# Patient Record
Sex: Female | Born: 1955 | Race: White | Hispanic: No | Marital: Single | Smoking: Former smoker
Health system: Southern US, Community
[De-identification: ages and names within clinical notes are randomized; demographics above are authoritative.]

## PROBLEM LIST (undated history)

## (undated) DIAGNOSIS — K5900 Constipation, unspecified: Secondary | ICD-10-CM

## (undated) DIAGNOSIS — R002 Palpitations: Secondary | ICD-10-CM

## (undated) DIAGNOSIS — K8020 Calculus of gallbladder without cholecystitis without obstruction: Secondary | ICD-10-CM

## (undated) DIAGNOSIS — E039 Hypothyroidism, unspecified: Secondary | ICD-10-CM

## (undated) HISTORY — DX: Hypothyroidism, unspecified: E03.9

## (undated) HISTORY — PX: COLONOSCOPY: SHX174

## (undated) HISTORY — DX: Constipation, unspecified: K59.00

## (undated) HISTORY — PX: CHOLECYSTECTOMY: SHX55

## (undated) HISTORY — DX: Palpitations: R00.2

## (undated) HISTORY — DX: Calculus of gallbladder without cholecystitis without obstruction: K80.20

## (undated) SURGERY — ERCP, WITH INTERVENTION IF INDICATED
Anesthesia: Moderate Sedation

---

## 1998-07-22 ENCOUNTER — Other Ambulatory Visit: Admission: RE | Admit: 1998-07-22 | Discharge: 1998-07-22 | Payer: Self-pay | Admitting: Obstetrics and Gynecology

## 1999-09-22 ENCOUNTER — Other Ambulatory Visit: Admission: RE | Admit: 1999-09-22 | Discharge: 1999-09-22 | Payer: Self-pay | Admitting: Obstetrics and Gynecology

## 2000-10-14 ENCOUNTER — Observation Stay (HOSPITAL_COMMUNITY): Admission: EM | Admit: 2000-10-14 | Discharge: 2000-10-15 | Payer: Self-pay | Admitting: Emergency Medicine

## 2000-11-06 ENCOUNTER — Other Ambulatory Visit: Admission: RE | Admit: 2000-11-06 | Discharge: 2000-11-06 | Payer: Self-pay | Admitting: Obstetrics and Gynecology

## 2002-01-08 ENCOUNTER — Encounter: Payer: Self-pay | Admitting: Family Medicine

## 2002-01-08 ENCOUNTER — Ambulatory Visit (HOSPITAL_COMMUNITY): Admission: RE | Admit: 2002-01-08 | Discharge: 2002-01-08 | Payer: Self-pay | Admitting: Family Medicine

## 2002-01-13 ENCOUNTER — Encounter: Payer: Self-pay | Admitting: Family Medicine

## 2002-01-13 ENCOUNTER — Encounter: Admission: RE | Admit: 2002-01-13 | Discharge: 2002-01-13 | Payer: Self-pay | Admitting: Family Medicine

## 2002-10-09 HISTORY — PX: ANKLE SURGERY: SHX546

## 2003-03-30 ENCOUNTER — Encounter: Admission: RE | Admit: 2003-03-30 | Discharge: 2003-03-30 | Payer: Self-pay | Admitting: Gynecology

## 2003-03-30 ENCOUNTER — Other Ambulatory Visit: Admission: RE | Admit: 2003-03-30 | Discharge: 2003-03-30 | Payer: Self-pay | Admitting: Gynecology

## 2003-03-30 ENCOUNTER — Encounter: Payer: Self-pay | Admitting: Gynecology

## 2004-03-30 ENCOUNTER — Encounter: Admission: RE | Admit: 2004-03-30 | Discharge: 2004-03-30 | Payer: Self-pay | Admitting: Gynecology

## 2004-04-04 ENCOUNTER — Other Ambulatory Visit: Admission: RE | Admit: 2004-04-04 | Discharge: 2004-04-04 | Payer: Self-pay | Admitting: Gynecology

## 2005-04-25 ENCOUNTER — Encounter: Admission: RE | Admit: 2005-04-25 | Discharge: 2005-04-25 | Payer: Self-pay | Admitting: Gynecology

## 2005-05-01 ENCOUNTER — Other Ambulatory Visit: Admission: RE | Admit: 2005-05-01 | Discharge: 2005-05-01 | Payer: Self-pay | Admitting: Gynecology

## 2006-06-07 ENCOUNTER — Other Ambulatory Visit: Admission: RE | Admit: 2006-06-07 | Discharge: 2006-06-07 | Payer: Self-pay | Admitting: Gynecology

## 2006-07-02 ENCOUNTER — Encounter: Admission: RE | Admit: 2006-07-02 | Discharge: 2006-07-02 | Payer: Self-pay | Admitting: Gynecology

## 2006-11-26 ENCOUNTER — Emergency Department (HOSPITAL_COMMUNITY): Admission: EM | Admit: 2006-11-26 | Discharge: 2006-11-26 | Payer: Self-pay | Admitting: Family Medicine

## 2008-07-15 ENCOUNTER — Emergency Department (HOSPITAL_COMMUNITY): Admission: EM | Admit: 2008-07-15 | Discharge: 2008-07-15 | Payer: Self-pay | Admitting: Family Medicine

## 2008-09-01 ENCOUNTER — Ambulatory Visit: Payer: Self-pay | Admitting: Internal Medicine

## 2008-09-10 ENCOUNTER — Telehealth: Payer: Self-pay | Admitting: Internal Medicine

## 2009-06-25 ENCOUNTER — Other Ambulatory Visit: Admission: RE | Admit: 2009-06-25 | Discharge: 2009-06-25 | Payer: Self-pay | Admitting: Family Medicine

## 2010-06-09 ENCOUNTER — Emergency Department (HOSPITAL_COMMUNITY): Admission: EM | Admit: 2010-06-09 | Discharge: 2010-06-09 | Payer: Self-pay | Admitting: Emergency Medicine

## 2010-12-06 ENCOUNTER — Other Ambulatory Visit (HOSPITAL_COMMUNITY)
Admission: RE | Admit: 2010-12-06 | Discharge: 2010-12-06 | Disposition: A | Discharge status: Home / Self Care | Payer: Medicaid Other | Source: Ambulatory Visit | Attending: Family Medicine | Admitting: Family Medicine

## 2010-12-06 ENCOUNTER — Other Ambulatory Visit: Payer: Self-pay | Admitting: Family Medicine

## 2010-12-06 DIAGNOSIS — Z124 Encounter for screening for malignant neoplasm of cervix: Secondary | ICD-10-CM | POA: Insufficient documentation

## 2010-12-22 LAB — LIPASE, BLOOD: Lipase: 37 U/L (ref 11–59)

## 2010-12-22 LAB — DIFFERENTIAL
Basophils Absolute: 0 10*3/uL (ref 0.0–0.1)
Basophils Relative: 1 % (ref 0–1)
Lymphocytes Relative: 33 % (ref 12–46)
Monocytes Absolute: 0.6 10*3/uL (ref 0.1–1.0)
Neutro Abs: 4.1 10*3/uL (ref 1.7–7.7)

## 2010-12-22 LAB — COMPREHENSIVE METABOLIC PANEL
AST: 96 U/L — ABNORMAL HIGH (ref 0–37)
BUN: 11 mg/dL (ref 6–23)
CO2: 27 mEq/L (ref 19–32)
Calcium: 9.7 mg/dL (ref 8.4–10.5)
Chloride: 101 mEq/L (ref 96–112)
Creatinine, Ser: 1.18 mg/dL (ref 0.4–1.2)
GFR calc non Af Amer: 48 mL/min — ABNORMAL LOW (ref >60)
Glucose, Bld: 113 mg/dL — ABNORMAL HIGH (ref 70–99)
Total Bilirubin: 0.9 mg/dL (ref 0.3–1.2)

## 2010-12-22 LAB — CBC
HCT: 37.5 % (ref 36.0–46.0)
Hemoglobin: 13.1 g/dL (ref 12.0–15.0)
MCH: 32.8 pg (ref 26.0–34.0)
MCV: 94.1 fL (ref 78.0–100.0)
RBC: 3.98 MIL/uL (ref 3.87–5.11)
WBC: 7.4 10*3/uL (ref 4.0–10.5)

## 2010-12-22 LAB — URINALYSIS, ROUTINE W REFLEX MICROSCOPIC
Bilirubin Urine: NEGATIVE
Ketones, ur: NEGATIVE mg/dL
Nitrite: NEGATIVE
Urobilinogen, UA: 0.2 mg/dL (ref 0.0–1.0)
pH: 6 (ref 5.0–8.0)

## 2010-12-22 LAB — URINE MICROSCOPIC-ADD ON

## 2011-02-24 NOTE — Op Note (Signed)
Krista Chapman [place]  Patient:    Krista Chapman, Krista Chapman                        MRN: [id number] Proc. Date: 10/14/00 Attending:  Jearld Adjutant, M.D. CC:         Office of Jearld Adjutant, M.D.  Otho Darner, M.D., Urgent Care  [facility]   Operative Report  PREOPERATIVE DIAGNOSIS:  Left ankle medial glass laceration puncture wound.  POSTOPERATIVE DIAGNOSES: 1. Left ankle medial glass laceration puncture wound with 10 additional tiny    shards of glass found. 2. A 75% laceration of posterior tibialis tendon. 3. Posterior one-third deep deltoid ligament laceration.  PROCEDURES: 1. Left ankle expiration and excision of glass foreign body. 2. Irrigation and debridement. 3. Repair posterior deltoid ligament. 4. Repair posterior tibialis tendon.  SURGEON:  Jearld Adjutant, M.D.  ASSISTANT:  ______ .  ANESTHESIA:  General with LMA.  CULTURES:  None.  DRAINS:  None.  ESTIMATED BLOOD LOSS:  Minimal.  TOURNIQUET TIME:  57 minutes.  PATHOLOGIC FINDINGS AND HISTORY:  [name] is a 55 year old female who claimed last evening to be up on a stepladder when she fell, twisting her ankle.  She felt she snagged it on something.  She began to have a pain today, noticed this laceration, and came into Urgent Care where Dr. Eula Listen and his P.A. probed the area after numbing it up and got several shards of glass out.  They felt they could see the shards on x-ray and that there were still some left.  I was then consulted, and I further probed it but felt I could not get to it without opening the wound further under anesthesia. Therefore, she was brought to [facility] where I made a zigzag incision on the proximal and distal limb, incorporating the slightly oblique transverse 1.5- to 2-cm wound.  I then dissected down and found that she had a laceration into one of the vessels, a small branch of the posterior tibialis artery, i.e.,  the tibial artery, and we tied this off.  There was also a laceration into the tendon sheath with a 75% laceration of the posterior tibialis tendon which was working preoperatively but only by this small strand.  In addition, there was a laceration of the posterior one-third of the deep deltoid ligament.  I probed all the way to the talus where the x-ray suggested the bone was.  I found several glass fragments in the more superficial neurovascular bundle area and several down deep to the bone.  At the end, I used a head lamp, 3.5 loops, and looked down against the bone, scraping it with a hemostat, and I could not feel any more gritty material that I had felt initially, just bone.  It was thoroughly flushed out deep against the talus and underneath the medial malleolus up and in all layers.  The specimen was given to the patient.  We repaired the posterior tibialis tendon with a Kessler-type stitch and a couple of interrupteds of 5-0 nylon and then tied off the vessel of the tibial artery and repaired the tendon sheath of the posterior tibialis tendon and repaired the posterior medial deltoid ligament with 2 figure-of-eight #0 Vicryl stitches.  DESCRIPTION OF PROCEDURE:  MAC anesthesia was obtained using LMA technique, 1 g Ancef, and a loading dose of tobramycin given for prophylaxis due to this environmental puncture wound.  The patient was  to have been given a tetanus toxoid in the triage area as Urgent Care did not have the medication.  I called the triage nurse for this.  We then prepped and draped the left foot in a standard fashion.  After standard prepping and draping, Esmarch exsanguination was used.  The tourniquet was blown up to 350 and later, 400 mmHg.  We made a proximal limb to connect to the anterior portion of the oblique transverse laceration just posterior and medial to the medial malleolus and then a distal limb in similar fashion on the posterior aspect of it.  The  incision was deep and sharp with a knife.  Hemostasis was obtained using the Bovie electrocoagulator, and dissection was carried down, and the retinaculum was released.  This exposed the neurovascular bundle.  The medial lateral plantar branches of the tibial nerve were identified and were not lacerated, and they were traced, and a tarsal tunnel release was carried out so they would not have pressure on them, especially distal.  We carefully observed them to make sure that there was no significant laceration of the nerve.  The laceration of the blood vessel was noted.  That was then tied off with 4-0 silk.  The main artery was still patent.  I then explored the tendon sheaths of the posterior tibialis tendon and the deep deltoid, flushing and carefully with loop ______ looking for all the glass shards.  These were removed, and the joint was thoroughly flushed down deep to the talus underneath the medial malleolus.  We then repaired the posterior deltoid ligament with 2 figure-of-eight #0 Vicryl sutures.  We repaired the posterior tibialis tendon with a Kessler stitch, as above, 5-0 clear nylon and the tendon sheath with 3-0 Vicryl.  I then closed the subcutaneous layer with 3-0 Vicryl and the skin with staples.  A bulky, sterile compressive dressing was applied with the ankle in neutral with a posterior plaster splint in a neutral position.  The patient, having tolerated the procedure well, was awakened and taken to the recovery room in satisfactory condition to be admitted for overnight antibiotics and pain medications.  She will be discharged tomorrow with nonweight-bearing with crutches to the office for placement of a Cam walker. DD:  10/14/00 TD:  10/14/00 Job: [id number]

## 2011-02-27 ENCOUNTER — Other Ambulatory Visit: Payer: Self-pay | Admitting: Family Medicine

## 2011-02-27 DIAGNOSIS — Z1231 Encounter for screening mammogram for malignant neoplasm of breast: Secondary | ICD-10-CM

## 2011-03-03 ENCOUNTER — Ambulatory Visit
Admission: RE | Admit: 2011-03-03 | Discharge: 2011-03-03 | Disposition: A | Discharge status: Home / Self Care | Payer: BC Managed Care – PPO | Source: Ambulatory Visit | Attending: Family Medicine | Admitting: Family Medicine

## 2011-03-03 DIAGNOSIS — Z1231 Encounter for screening mammogram for malignant neoplasm of breast: Secondary | ICD-10-CM

## 2011-04-07 ENCOUNTER — Telehealth: Payer: Self-pay | Admitting: *Deleted

## 2011-04-07 NOTE — Telephone Encounter (Signed)
Per email from MR pt needs to have PFT ASAP and then ov with him in August. Pt set to have PFT on 04/10/11 at 9am and OV on 05-10-11. Carron Curie, CMA

## 2011-04-10 ENCOUNTER — Ambulatory Visit (INDEPENDENT_AMBULATORY_CARE_PROVIDER_SITE_OTHER): Payer: BC Managed Care – PPO | Admitting: Internal Medicine

## 2011-04-10 DIAGNOSIS — R0989 Other specified symptoms and signs involving the circulatory and respiratory systems: Secondary | ICD-10-CM

## 2011-04-10 DIAGNOSIS — R0609 Other forms of dyspnea: Secondary | ICD-10-CM

## 2011-04-10 LAB — PULMONARY FUNCTION TEST

## 2011-04-10 NOTE — Progress Notes (Signed)
PFT done today. 

## 2011-04-11 ENCOUNTER — Telehealth: Payer: Self-pay | Admitting: Internal Medicine

## 2011-04-11 NOTE — Telephone Encounter (Signed)
pfts only show isolated low dlco. Needs walking test when I see patient on 05/10/2011. Also needs records from [facility]. If symptoms get worse, needs to see another provider

## 2011-04-14 ENCOUNTER — Telehealth: Payer: Self-pay | Admitting: Internal Medicine

## 2011-04-14 NOTE — Telephone Encounter (Signed)
Spoke with the pt and advised of PFT results and the need for walk test at next OV. I will get records from [facility]. Carron Curie, CMA

## 2011-04-14 NOTE — Telephone Encounter (Signed)
Duplicate messgae.Krista Chapman, CMA

## 2011-04-17 ENCOUNTER — Encounter: Payer: Self-pay | Admitting: Internal Medicine

## 2011-04-26 ENCOUNTER — Other Ambulatory Visit: Payer: Self-pay | Admitting: Gastroenterology

## 2011-05-10 ENCOUNTER — Ambulatory Visit (INDEPENDENT_AMBULATORY_CARE_PROVIDER_SITE_OTHER): Payer: BC Managed Care – PPO | Admitting: Internal Medicine

## 2011-05-10 ENCOUNTER — Encounter: Payer: Self-pay | Admitting: Internal Medicine

## 2011-05-10 VITALS — BP 118/78 | HR 64 | Temp 98.0°F | Ht 66.0 in | Wt 165.0 lb

## 2011-05-10 DIAGNOSIS — R942 Abnormal results of pulmonary function studies: Secondary | ICD-10-CM

## 2011-05-10 DIAGNOSIS — R0609 Other forms of dyspnea: Secondary | ICD-10-CM

## 2011-05-10 DIAGNOSIS — R0989 Other specified symptoms and signs involving the circulatory and respiratory systems: Secondary | ICD-10-CM

## 2011-05-10 DIAGNOSIS — R0600 Dyspnea, unspecified: Secondary | ICD-10-CM | POA: Insufficient documentation

## 2011-05-10 DIAGNOSIS — R0981 Nasal congestion: Secondary | ICD-10-CM

## 2011-05-10 NOTE — Progress Notes (Signed)
Subjective:    Patient ID: Krista Chapman, female    DOB: 03/25/1956, 55 y.o.   MRN: [id number]  HPI  55 year old. Referred by Dr. Allyson Sabal for dyspnea. Insidious onset 7-8 months ago. Stable since onset. Brought on mostly be exertion and relieved by rest. However, does notice dyspnea during long conversations at rest as well. 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 She exercises regularly by fast walking 3 times a week on average for 45 minutes. She gets dyspneic during this as soon as she starts walking but she is able to continue but when walking up an incline she will have to stop 2-3 times and take rest. There is asssociated increased diaophresis and facial redness compared to friends but this is chronic and does not feel is related to symptoms. There is no associated chest pain (cardiac workup negative including myoview stres test and resting echo per review of Dr. Allyson Sabal file), wheezing, cough, syncope, pre-syncope (though admits to occassional dizzy spells +), orthopnea, paroxysmal nocturnal dyspnea. There was one associated episode of swaying to left side some months ago during walking  Past med hx significant for almost non-smoker but prior heavy passive smoking in 1980s for > 10 years, possible stage 3 Chronic kidney disease (Dr Caryn Section, 24h urine protein pending), PACs and occ PVCs. At work, inhales chemicals (nails, hair color - ongoing). Fam hx - died of emphysema, maternal aunt with asthma.   She is concerned about etiology. FEels her parents died from heart and lung disease and she feels she wants to be ahead of the curve. Also wondering if anxiety is causing dyspnea and if chronic nasal blockage from sinuses is contributing  PFTs last month  Are normal but has isolated low diffusion 67%  Past Medical History    Diagnosis Date  . Gallstones   . Palpitation      Family History  Problem Relation Age of Onset  . Emphysema Father   . Heart disease Mother      History   Social History  . Marital Status: Single    Spouse Name: N/A    Number of Children: N/A  . Years of Education: N/A   Occupational History  . comsmet[employer]    Social History Main Topics  . Smoking status: Former Smoker -- 0.1 packs/day for 3 years    Types: Cigarettes    Quit date: 10/09/1988  . Smokeless tobacco: Not on file  . Alcohol Use: Yes     social  . Drug Use: No  . Sexually Active: Not on file   Other Topics Concern  . Not on file   Social History Narrative  . No narrative on file     Allergies  Allergen Reactions  . Codeine     REACTION: nausea     No outpatient prescriptions prior to visit.      Review of Systems  Constitutional: Negative for fever and unexpected weight change.  HENT: Positive for congestion. Negative for ear pain, nosebleeds, sore throat, rhinorrhea, sneezing, trouble swallowing, dental problem, postnasal drip and sinus pressure.   Eyes: Negative for redness and itching.  Respiratory: Positive for shortness of breath. Negative for cough, chest tightness and wheezing.   Cardiovascular: Positive for palpitations. Negative for leg swelling.  Gastrointestinal: Negative for nausea and vomiting.  Genitourinary: Negative for  dysuria.  Musculoskeletal: Negative for joint swelling.  Skin: Negative for rash.  Neurological: Positive for headaches.  Hematological: Does not bruise/bleed easily.  Psychiatric/Behavioral: Positive for dysphoric mood. The patient is not nervous/anxious.        Objective:   Physical Exam  Vitals reviewed. Constitutional: She is oriented to person, place, and time. She appears well-developed and well-nourished. No distress.  HENT:  Head: Normocephalic and atraumatic.  Right Ear: External ear normal.  Left Ear: External ear normal.   Mouth/Throat: Oropharynx is clear and moist. No oropharyngeal exudate.  Eyes: Conjunctivae and EOM are normal. Pupils are equal, round, and reactive to light. Right eye exhibits no discharge. Left eye exhibits no discharge. No scleral icterus.  Neck: Normal range of motion. Neck supple. No JVD present. No tracheal deviation present. No thyromegaly present.  Cardiovascular: Normal rate, regular rhythm, normal heart sounds and intact distal pulses.  Exam reveals no gallop and no friction rub.   No murmur heard. Pulmonary/Chest: Effort normal and breath sounds normal. No respiratory distress. She has no wheezes. She has no rales. She exhibits no tenderness.  Abdominal: Soft. Bowel sounds are normal. She exhibits no distension and no mass. There is no tenderness. There is no rebound and no guarding.  Musculoskeletal: Normal range of motion. She exhibits no edema and no tenderness.  Lymphadenopathy:    She has no cervical adenopathy.  Neurological: She is alert and oriented to person, place, and time. She has normal reflexes. No cranial nerve deficit. She exhibits normal muscle tone. Coordination normal.  Skin: Skin is warm and dry. No rash noted. She is not diaphoretic. No erythema. No pallor.  Psychiatric: She has a normal mood and affect. Her behavior is normal. Judgment and thought content normal.       Mild anxious disposition          Assessment & Plan:

## 2011-05-10 NOTE — Assessment & Plan Note (Signed)
She has isolated low diffusion in PFTs. Denies being anemic. DDx is sublte emphysema or ILD (hx of passive smoking and ongoing fume exposure) but also possible this is not cause for dyspnea and blocked sinuses, anxiety or asthma (occult) are playing a role  Plan Step 1 get HRCT chest without contrast IFnegative, do CPST bike test [facility] to get ENT consult for sinus congestion and blocked nose  She is agreeable with plan

## 2011-05-10 NOTE — Patient Instructions (Signed)
Please do High resolution CT chest without contrast - reason: abnormal PFT - rule out ILD or emphysema I will call you with test result For sinuses - if you want we can refer you to ENT - Dr. Annalee Genta

## 2011-05-12 ENCOUNTER — Ambulatory Visit (INDEPENDENT_AMBULATORY_CARE_PROVIDER_SITE_OTHER)
Admission: RE | Admit: 2011-05-12 | Discharge: 2011-05-12 | Disposition: A | Discharge status: Home / Self Care | Payer: BC Managed Care – PPO | Source: Ambulatory Visit | Attending: Internal Medicine | Admitting: Internal Medicine

## 2011-05-12 DIAGNOSIS — R942 Abnormal results of pulmonary function studies: Secondary | ICD-10-CM

## 2011-05-16 ENCOUNTER — Telehealth: Payer: Self-pay | Admitting: Internal Medicine

## 2011-05-16 DIAGNOSIS — R0600 Dyspnea, unspecified: Secondary | ICD-10-CM

## 2011-05-16 NOTE — Telephone Encounter (Signed)
Addended by: Darrell Jewel on: 05/16/2011 05:24 PM   Modules accepted: Orders

## 2011-05-16 NOTE — Telephone Encounter (Signed)
Let her know CT chest is normal. Therefore, proceed with CPST bike test with EIB challenge and return to see me after that. I have ordered cpst

## 2011-05-16 NOTE — Telephone Encounter (Signed)
Spoke with pt and is aware order was sent for cpst bike test with eib challenge. She is aware [facility] will be contacting her.

## 2011-05-16 NOTE — Telephone Encounter (Signed)
Pt returning call can be reached at [phone].Raylene Everts

## 2011-05-24 ENCOUNTER — Ambulatory Visit (HOSPITAL_COMMUNITY): Payer: BC Managed Care – PPO

## 2011-07-10 LAB — POCT RAPID STREP A: Streptococcus, Group A Screen (Direct): NEGATIVE

## 2012-08-02 ENCOUNTER — Other Ambulatory Visit: Payer: Self-pay | Admitting: Family Medicine

## 2012-08-02 DIAGNOSIS — Z1231 Encounter for screening mammogram for malignant neoplasm of breast: Secondary | ICD-10-CM

## 2012-08-08 ENCOUNTER — Ambulatory Visit (INDEPENDENT_AMBULATORY_CARE_PROVIDER_SITE_OTHER): Payer: BC Managed Care – PPO | Admitting: General Surgery

## 2012-08-09 ENCOUNTER — Ambulatory Visit
Admission: RE | Admit: 2012-08-09 | Discharge: 2012-08-09 | Disposition: A | Discharge status: Home / Self Care | Payer: BC Managed Care – PPO | Source: Ambulatory Visit | Attending: Family Medicine | Admitting: Family Medicine

## 2012-08-09 DIAGNOSIS — Z1231 Encounter for screening mammogram for malignant neoplasm of breast: Secondary | ICD-10-CM

## 2012-12-03 ENCOUNTER — Other Ambulatory Visit: Payer: Self-pay | Admitting: Dermatology

## 2013-01-20 ENCOUNTER — Encounter (INDEPENDENT_AMBULATORY_CARE_PROVIDER_SITE_OTHER): Payer: Self-pay

## 2013-02-12 ENCOUNTER — Other Ambulatory Visit (HOSPITAL_COMMUNITY): Payer: Self-pay | Admitting: Family Medicine

## 2013-02-12 ENCOUNTER — Ambulatory Visit (HOSPITAL_COMMUNITY)
Admission: RE | Admit: 2013-02-12 | Discharge: 2013-02-12 | Disposition: A | Discharge status: Home / Self Care | Payer: BC Managed Care – PPO | Source: Ambulatory Visit | Attending: Family Medicine | Admitting: Family Medicine

## 2013-02-12 DIAGNOSIS — M7989 Other specified soft tissue disorders: Secondary | ICD-10-CM

## 2013-02-12 NOTE — Progress Notes (Signed)
*  PRELIMINARY RESULTS* Vascular Ultrasound Left lower extremity venous duplex has been completed.  Preliminary findings: Left:  No evidence of DVT, superficial thrombosis, or Baker's cyst.  Attempted call report to MD cell number. Left voice message.   Farrel Demark, RDMS, RVT  02/12/2013, 3:04 PM

## 2013-03-21 ENCOUNTER — Other Ambulatory Visit: Payer: Self-pay | Admitting: *Deleted

## 2013-03-21 MED ORDER — AMLODIPINE BESYLATE 2.5 MG PO TABS: 2.5000 mg | ORAL_TABLET | Freq: Every day | ORAL | Status: DC

## 2013-06-23 ENCOUNTER — Other Ambulatory Visit: Payer: Self-pay | Admitting: Cardiovascular Disease

## 2013-06-24 NOTE — Telephone Encounter (Signed)
Rx was sent to pharmacy electronically. 

## 2013-07-25 ENCOUNTER — Other Ambulatory Visit: Payer: Self-pay

## 2013-07-25 DIAGNOSIS — Z1231 Encounter for screening mammogram for malignant neoplasm of breast: Secondary | ICD-10-CM

## 2013-08-22 ENCOUNTER — Ambulatory Visit
Admission: RE | Admit: 2013-08-22 | Discharge: 2013-08-22 | Disposition: A | Discharge status: Home / Self Care | Payer: BC Managed Care – PPO | Source: Ambulatory Visit

## 2013-08-22 DIAGNOSIS — Z1231 Encounter for screening mammogram for malignant neoplasm of breast: Secondary | ICD-10-CM

## 2013-08-28 ENCOUNTER — Other Ambulatory Visit: Payer: Self-pay | Admitting: Otolaryngology

## 2013-08-28 ENCOUNTER — Other Ambulatory Visit: Payer: Self-pay | Admitting: Dermatology

## 2013-08-28 DIAGNOSIS — H905 Unspecified sensorineural hearing loss: Secondary | ICD-10-CM

## 2013-08-28 DIAGNOSIS — H9319 Tinnitus, unspecified ear: Secondary | ICD-10-CM

## 2013-08-28 DIAGNOSIS — G43909 Migraine, unspecified, not intractable, without status migrainosus: Secondary | ICD-10-CM

## 2013-08-29 ENCOUNTER — Ambulatory Visit
Admission: RE | Admit: 2013-08-29 | Discharge: 2013-08-29 | Disposition: A | Discharge status: Home / Self Care | Payer: BC Managed Care – PPO | Source: Ambulatory Visit | Attending: Otolaryngology | Admitting: Otolaryngology

## 2013-08-29 DIAGNOSIS — H905 Unspecified sensorineural hearing loss: Secondary | ICD-10-CM

## 2013-08-29 DIAGNOSIS — H9319 Tinnitus, unspecified ear: Secondary | ICD-10-CM

## 2013-08-29 DIAGNOSIS — G43909 Migraine, unspecified, not intractable, without status migrainosus: Secondary | ICD-10-CM

## 2013-08-29 MED ORDER — IOHEXOL 300 MG/ML  SOLN
75.0000 mL | Freq: Once | INTRAMUSCULAR | Status: AC | PRN
  Administered 2013-08-29: 75 mL via INTRAVENOUS

## 2013-09-15 ENCOUNTER — Telehealth: Payer: Self-pay | Admitting: *Deleted

## 2013-09-15 ENCOUNTER — Encounter: Payer: Self-pay | Admitting: Podiatry

## 2013-09-15 ENCOUNTER — Ambulatory Visit (INDEPENDENT_AMBULATORY_CARE_PROVIDER_SITE_OTHER): Payer: BC Managed Care – PPO

## 2013-09-15 ENCOUNTER — Ambulatory Visit (INDEPENDENT_AMBULATORY_CARE_PROVIDER_SITE_OTHER): Payer: BC Managed Care – PPO | Admitting: Podiatry

## 2013-09-15 VITALS — BP 140/79 | HR 75 | Resp 16 | Ht 66.0 in | Wt 165.0 lb

## 2013-09-15 DIAGNOSIS — M79672 Pain in left foot: Secondary | ICD-10-CM

## 2013-09-15 DIAGNOSIS — M898X9 Other specified disorders of bone, unspecified site: Secondary | ICD-10-CM

## 2013-09-15 DIAGNOSIS — M79609 Pain in unspecified limb: Secondary | ICD-10-CM

## 2013-09-15 DIAGNOSIS — L600 Ingrowing nail: Secondary | ICD-10-CM

## 2013-09-15 NOTE — Telephone Encounter (Signed)
Biopsy of soft tissue tumor of right medial toenail border sent by [employer] on 09/16/2013.
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
## 2013-09-15 NOTE — Progress Notes (Signed)
   Subjective:    Patient ID: Krista Chapman, female    DOB: 01-30-1956, 57 y.o.   MRN: [id number]  HPI Comments: "I've got an ingrown toenail"  Pt c/o ingrown toenail 1st toe right, medial corner. Had AP nail procedure by another podiatrist 1 year ago and still continues to have problems. Medial corner of nail thickened and skin is thick and callused. It's aches all the time. She trims it down occasionally. Pressure is aggravating.   ---Pt also states that the 4th toe on the left foot gets sharp, shooting pains through it and get numb and tingly. Callused plantar forefoot.     Review of Systems  Constitutional: Positive for fatigue.  HENT: Positive for sinus pressure and tinnitus.   Skin:       Change in nails  All other systems reviewed and are negative.       Objective:   Physical Exam        Assessment & Plan:

## 2013-09-15 NOTE — Patient Instructions (Signed)

## 2013-09-29 ENCOUNTER — Ambulatory Visit: Payer: BC Managed Care – PPO | Admitting: Podiatry

## 2013-11-29 ENCOUNTER — Other Ambulatory Visit: Payer: Self-pay | Admitting: Cardiovascular Disease

## 2013-12-18 ENCOUNTER — Other Ambulatory Visit: Payer: Self-pay | Admitting: Dermatology

## 2014-01-06 ENCOUNTER — Other Ambulatory Visit: Payer: Self-pay | Admitting: *Deleted

## 2014-01-06 MED ORDER — AMLODIPINE BESYLATE 2.5 MG PO TABS: ORAL_TABLET | ORAL | Status: DC

## 2014-01-06 NOTE — Telephone Encounter (Signed)
Rx was sent to pharmacy electronically. Last OV 10/2012 

## 2014-01-19 ENCOUNTER — Other Ambulatory Visit: Payer: Self-pay | Admitting: *Deleted

## 2014-01-21 ENCOUNTER — Other Ambulatory Visit: Payer: Self-pay | Admitting: *Deleted

## 2014-03-11 ENCOUNTER — Encounter: Payer: Self-pay | Admitting: Podiatry

## 2014-03-17 ENCOUNTER — Encounter: Payer: Self-pay | Admitting: *Deleted

## 2014-03-18 ENCOUNTER — Encounter (INDEPENDENT_AMBULATORY_CARE_PROVIDER_SITE_OTHER): Payer: Self-pay

## 2014-03-18 ENCOUNTER — Ambulatory Visit (INDEPENDENT_AMBULATORY_CARE_PROVIDER_SITE_OTHER): Payer: BC Managed Care – PPO | Admitting: Diagnostic Neuroimaging

## 2014-03-18 ENCOUNTER — Ambulatory Visit (INDEPENDENT_AMBULATORY_CARE_PROVIDER_SITE_OTHER): Payer: BC Managed Care – PPO | Admitting: Neurology

## 2014-03-18 ENCOUNTER — Encounter: Payer: Self-pay | Admitting: Neurology

## 2014-03-18 ENCOUNTER — Encounter (INDEPENDENT_AMBULATORY_CARE_PROVIDER_SITE_OTHER): Payer: BC Managed Care – PPO

## 2014-03-18 VITALS — BP 129/85 | HR 73 | Ht 66.0 in | Wt 178.0 lb

## 2014-03-18 DIAGNOSIS — R531 Weakness: Secondary | ICD-10-CM

## 2014-03-18 DIAGNOSIS — R202 Paresthesia of skin: Secondary | ICD-10-CM

## 2014-03-18 DIAGNOSIS — H539 Unspecified visual disturbance: Secondary | ICD-10-CM

## 2014-03-18 DIAGNOSIS — R5383 Other fatigue: Secondary | ICD-10-CM

## 2014-03-18 DIAGNOSIS — R209 Unspecified disturbances of skin sensation: Secondary | ICD-10-CM

## 2014-03-18 DIAGNOSIS — R5381 Other malaise: Secondary | ICD-10-CM

## 2014-03-18 DIAGNOSIS — Z0289 Encounter for other administrative examinations: Secondary | ICD-10-CM

## 2014-03-18 NOTE — Patient Instructions (Signed)
Overall you are doing fairly well but I do want to suggest a few things today:   Remember to drink plenty of fluid, eat healthy meals and do not skip any meals. Try to eat protein with a every meal and eat a healthy snack such as fruit or nuts in between meals. Try to keep a regular sleep-wake schedule and try to exercise daily, particularly in the form of walking, 20-30 minutes a day, if you can.   As far as diagnostic testing:  1)Please have some blood work completed today 2)Please schedule an EMG/NCS when you check out 3)I would like you to have a MRI of the brain completed, you will be called to schedule this  We will follow up with you once the above is completed. Please call us with any interim questions, concerns, problems, updates or refill requests.   Please also call us for any test results so we can go over those with you on the phone.  My clinical assistant and will answer any of your questions and relay your messages to me and also relay most of my messages to you.   Our phone number is [phone]. We also have an after hours call service for urgent matters and there is a physician on-call for urgent questions. For any emergencies you know to call 911 or go to the nearest emergency room
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
## 2014-03-18 NOTE — Procedures (Signed)
   [facility]  NCS (NERVE CONDUCTION STUDY) WITH EMG (ELECTROMYOGRAPHY) REPORT   STUDY DATE: 03/18/14 PATIENT NAME: Krista Chapman DOB: [birth year] MRN: [id number]  ORDERING CLINICIAN: Elspeth Cho, DO   TECHNOLOGIST: Gearldine Shown ELECTROMYOGRAPHER: Glenford Bayley. Caili Escalera, MD  CLINICAL INFORMATION: 58 year old female with bilateral hand and left leg pain and numbness.  FINDINGS: NERVE CONDUCTION STUDY: Bilateral median, bilateral ulnar, left peroneal and left tibial motor responses have normal distal latencies, amplitudes, conduction velocities and F-wave latencies. Bilateral median, bilateral ulnar and left peroneal sensory responses are normal.  NEEDLE ELECTROMYOGRAPHY: Needle examination of right upper extremity and left lower extremity (deltoid, biceps, triceps, flexor carpi radialis, first dorsal interosseous, vastus medialis, tibialis anterior, gastrocnemius) is normal.  IMPRESSION:  This is a normal study. No electrodiagnostic evidence of large fiber neuropathy or myopathy at this time.   INTERPRETING PHYSICIAN:  Suanne Marker, MD Certified in Neurology, Neurophysiology and Neuroimaging  [facility] [address] [place], [place] [address] [phone]

## 2014-03-19 ENCOUNTER — Ambulatory Visit (INDEPENDENT_AMBULATORY_CARE_PROVIDER_SITE_OTHER): Payer: BC Managed Care – PPO

## 2014-03-19 DIAGNOSIS — R202 Paresthesia of skin: Secondary | ICD-10-CM

## 2014-03-19 DIAGNOSIS — R531 Weakness: Secondary | ICD-10-CM

## 2014-03-19 DIAGNOSIS — R209 Unspecified disturbances of skin sensation: Secondary | ICD-10-CM

## 2014-03-19 DIAGNOSIS — H539 Unspecified visual disturbance: Secondary | ICD-10-CM

## 2014-03-19 DIAGNOSIS — R5383 Other fatigue: Secondary | ICD-10-CM

## 2014-03-19 DIAGNOSIS — R5381 Other malaise: Secondary | ICD-10-CM

## 2014-03-19 LAB — VITAMIN B12: VITAMIN B 12: 501 pg/mL (ref 211–946)

## 2014-03-19 LAB — ANA W/REFLEX IF POSITIVE: ANA: NEGATIVE

## 2014-03-20 ENCOUNTER — Telehealth: Payer: Self-pay | Admitting: Neurology

## 2014-03-20 NOTE — Telephone Encounter (Signed)
Patient calling to get MRI results..Stated she's feeling worse.  Please call and advise, per patient you can leave message on vm.

## 2014-03-20 NOTE — Progress Notes (Signed)
Quick Note:  Left voice message of normal EMG/NCS, call back with any questions or concerns. ______

## 2014-03-20 NOTE — Telephone Encounter (Signed)
Informed patient that results are not ready yet,will call back once the physician has reviewed

## 2014-03-24 ENCOUNTER — Other Ambulatory Visit: Payer: Self-pay | Admitting: Neurology

## 2014-03-24 DIAGNOSIS — G35 Multiple sclerosis: Secondary | ICD-10-CM

## 2014-03-24 NOTE — Telephone Encounter (Signed)
Pt called back concerning her MRI results and she was afraid if we called her back she would miss the call with her being at work. I ask pt if she signed a paper saying it was ok to leave results on her vm and she said she, I also ask her if pt didn't answer it was ok to leave the results on her vm pt said ok to leave results. She is concerned about the results and that is why she is calling back. Thanks

## 2014-03-24 NOTE — Telephone Encounter (Signed)
Patient is requesting MRI results that were completed on 03/18/14

## 2014-04-01 ENCOUNTER — Ambulatory Visit (INDEPENDENT_AMBULATORY_CARE_PROVIDER_SITE_OTHER): Payer: BC Managed Care – PPO

## 2014-04-01 DIAGNOSIS — G35 Multiple sclerosis: Secondary | ICD-10-CM

## 2014-04-01 MED ORDER — GADOPENTETATE DIMEGLUMINE 469.01 MG/ML IV SOLN: 16.0000 mL | Freq: Once | INTRAVENOUS | Status: AC | PRN

## 2014-04-02 NOTE — Telephone Encounter (Signed)
Spoke with patient and explained that results has not been reviewed by physician as of yet, once reviewed will call back with results

## 2014-04-02 NOTE — Telephone Encounter (Signed)
Patient calling back to get MRI results.

## 2014-04-02 NOTE — Telephone Encounter (Signed)
Patient calling about MRI results from 6/24.

## 2014-04-03 NOTE — Telephone Encounter (Signed)
Pt is calling back does not want to wait over the weekend to find out if she has MS, calling to find out results from MRI done 04/01/14. Pt also states she is seeing her PCP Monday and needs to have the results to give to him to compare notes. [facility] Hosie PoissonSumner to call pt back concerning this matter. Thanks

## 2014-04-06 ENCOUNTER — Telehealth: Payer: Self-pay | Admitting: Neurology

## 2014-04-06 DIAGNOSIS — G35 Multiple sclerosis: Secondary | ICD-10-CM

## 2014-04-06 NOTE — Telephone Encounter (Signed)
Called patient to discuss her MRI C spine results. Explained to her that it shows some degenerative changes but no findings consistent with MS. Discussed with her that her MRI brain findings were non-specific and could be caused by multiple different factors, including MS. She inquired about next steps and we discussed a possible lumbar puncture. She wishes to proceed with this. Will order LP. Can consider rheum referral and/or formal cognitive testing in the future. Do suspect stress/anxiety is likely playing a role in her symptoms.

## 2014-04-09 ENCOUNTER — Telehealth: Payer: Self-pay | Admitting: Neurology

## 2014-04-09 NOTE — Telephone Encounter (Signed)
Patient calling to state that she still has not heard anything regarding scheduling a lumbar puncture. Please return call to patient and advise.

## 2014-04-09 NOTE — Telephone Encounter (Signed)
Delora FuelDebbie Handoga out of the office. Faxed order and notes to GI [phone].

## 2014-04-09 NOTE — Telephone Encounter (Signed)
Information was given to Dr. Minus Breeding assistant, [place], [place]. Please advise

## 2014-04-16 ENCOUNTER — Ambulatory Visit
Admission: RE | Admit: 2014-04-16 | Discharge: 2014-04-16 | Disposition: A | Discharge status: Home / Self Care | Payer: BC Managed Care – PPO | Source: Ambulatory Visit | Attending: Neurology | Admitting: Neurology

## 2014-04-16 VITALS — BP 116/66 | HR 55

## 2014-04-16 DIAGNOSIS — G35 Multiple sclerosis: Secondary | ICD-10-CM

## 2014-04-16 LAB — CSF CELL COUNT WITH DIFFERENTIAL
RBC COUNT CSF: 0 uL
Tube #: 1
WBC, CSF: 0 cu mm (ref 0–5)

## 2014-04-16 LAB — PROTEIN, CSF: Total Protein, CSF: 29 mg/dL (ref 15–45)

## 2014-04-16 NOTE — Progress Notes (Signed)
One tiger-topped tube of blood drawn from left [facility] space without difficulty for LP labs; site unremarkable.  dd

## 2014-04-16 NOTE — Discharge Instructions (Signed)

## 2014-04-17 ENCOUNTER — Emergency Department (HOSPITAL_COMMUNITY): Payer: BC Managed Care – PPO

## 2014-04-17 ENCOUNTER — Encounter (HOSPITAL_COMMUNITY): Payer: Self-pay | Admitting: Emergency Medicine

## 2014-04-17 ENCOUNTER — Inpatient Hospital Stay (HOSPITAL_COMMUNITY)
Admission: EM | Admit: 2014-04-17 | Discharge: 2014-04-27 | DRG: 444 | Disposition: A | Discharge status: Other Acute Inpatient Hospital | Payer: BC Managed Care – PPO | Attending: Internal Medicine | Admitting: Internal Medicine

## 2014-04-17 DIAGNOSIS — Z6827 Body mass index (BMI) 27.0-27.9, adult: Secondary | ICD-10-CM

## 2014-04-17 DIAGNOSIS — K560 Paralytic ileus: Secondary | ICD-10-CM | POA: Diagnosis not present

## 2014-04-17 DIAGNOSIS — N179 Acute kidney failure, unspecified: Secondary | ICD-10-CM | POA: Diagnosis present

## 2014-04-17 DIAGNOSIS — R1011 Right upper quadrant pain: Secondary | ICD-10-CM

## 2014-04-17 DIAGNOSIS — Z87891 Personal history of nicotine dependence: Secondary | ICD-10-CM

## 2014-04-17 DIAGNOSIS — Z833 Family history of diabetes mellitus: Secondary | ICD-10-CM

## 2014-04-17 DIAGNOSIS — Z8249 Family history of ischemic heart disease and other diseases of the circulatory system: Secondary | ICD-10-CM

## 2014-04-17 DIAGNOSIS — F411 Generalized anxiety disorder: Secondary | ICD-10-CM | POA: Diagnosis present

## 2014-04-17 DIAGNOSIS — K859 Acute pancreatitis without necrosis or infection, unspecified: Secondary | ICD-10-CM | POA: Diagnosis not present

## 2014-04-17 DIAGNOSIS — Z79899 Other long term (current) drug therapy: Secondary | ICD-10-CM

## 2014-04-17 DIAGNOSIS — E039 Hypothyroidism, unspecified: Secondary | ICD-10-CM | POA: Diagnosis present

## 2014-04-17 DIAGNOSIS — K8070 Calculus of gallbladder and bile duct without cholecystitis without obstruction: Principal | ICD-10-CM | POA: Diagnosis present

## 2014-04-17 DIAGNOSIS — D72829 Elevated white blood cell count, unspecified: Secondary | ICD-10-CM | POA: Diagnosis not present

## 2014-04-17 DIAGNOSIS — IMO0002 Reserved for concepts with insufficient information to code with codable children: Secondary | ICD-10-CM | POA: Diagnosis not present

## 2014-04-17 DIAGNOSIS — K8050 Calculus of bile duct without cholangitis or cholecystitis without obstruction: Secondary | ICD-10-CM

## 2014-04-17 DIAGNOSIS — J90 Pleural effusion, not elsewhere classified: Secondary | ICD-10-CM | POA: Diagnosis not present

## 2014-04-17 DIAGNOSIS — K668 Other specified disorders of peritoneum: Secondary | ICD-10-CM | POA: Diagnosis not present

## 2014-04-17 DIAGNOSIS — R509 Fever, unspecified: Secondary | ICD-10-CM | POA: Clinically undetermined

## 2014-04-17 LAB — COMPREHENSIVE METABOLIC PANEL
ALT: 93 U/L — ABNORMAL HIGH (ref 0–35)
AST: 174 U/L — ABNORMAL HIGH (ref 0–37)
Albumin: 3.8 g/dL (ref 3.5–5.2)
Alkaline Phosphatase: 93 U/L (ref 39–117)
Anion gap: 15 (ref 5–15)
BILIRUBIN TOTAL: 0.8 mg/dL (ref 0.3–1.2)
BUN: 14 mg/dL (ref 6–23)
CHLORIDE: 95 meq/L — AB (ref 96–112)
CO2: 27 meq/L (ref 19–32)
CREATININE: 1.26 mg/dL — AB (ref 0.50–1.10)
Calcium: 9.3 mg/dL (ref 8.4–10.5)
GFR calc Af Amer: 54 mL/min — ABNORMAL LOW (ref >90)
GFR, EST NON AFRICAN AMERICAN: 46 mL/min — AB (ref >90)
GLUCOSE: 141 mg/dL — AB (ref 70–99)
Potassium: 4.1 mEq/L (ref 3.7–5.3)
Sodium: 137 mEq/L (ref 137–147)
Total Protein: 6.9 g/dL (ref 6.0–8.3)

## 2014-04-17 LAB — CBC WITH DIFFERENTIAL/PLATELET
Basophils Absolute: 0 10*3/uL (ref 0.0–0.1)
Basophils Relative: 0 % (ref 0–1)
Eosinophils Absolute: 0.1 10*3/uL (ref 0.0–0.7)
Eosinophils Relative: 1 % (ref 0–5)
HEMATOCRIT: 40.3 % (ref 36.0–46.0)
Hemoglobin: 14.1 g/dL (ref 12.0–15.0)
LYMPHS ABS: 1.2 10*3/uL (ref 0.7–4.0)
LYMPHS PCT: 12 % (ref 12–46)
MCH: 31.5 pg (ref 26.0–34.0)
MCHC: 35 g/dL (ref 30.0–36.0)
MCV: 90.2 fL (ref 78.0–100.0)
MONO ABS: 0.5 10*3/uL (ref 0.1–1.0)
MONOS PCT: 6 % (ref 3–12)
NEUTROS ABS: 8.1 10*3/uL — AB (ref 1.7–7.7)
Neutrophils Relative %: 81 % — ABNORMAL HIGH (ref 43–77)
Platelets: 196 10*3/uL (ref 150–400)
RBC: 4.47 MIL/uL (ref 3.87–5.11)
RDW: 12.4 % (ref 11.5–15.5)
WBC: 9.9 10*3/uL (ref 4.0–10.5)

## 2014-04-17 LAB — URINE MICROSCOPIC-ADD ON

## 2014-04-17 LAB — URINALYSIS, ROUTINE W REFLEX MICROSCOPIC
BILIRUBIN URINE: NEGATIVE
Glucose, UA: NEGATIVE mg/dL
Hgb urine dipstick: NEGATIVE
KETONES UR: NEGATIVE mg/dL
Nitrite: NEGATIVE
PH: 8 (ref 5.0–8.0)
Protein, ur: NEGATIVE mg/dL
Specific Gravity, Urine: 1.014 (ref 1.005–1.030)
Urobilinogen, UA: 0.2 mg/dL (ref 0.0–1.0)

## 2014-04-17 LAB — LIPASE, BLOOD: LIPASE: 43 U/L (ref 11–59)

## 2014-04-17 MED ORDER — SODIUM CHLORIDE 0.9 % IV SOLN
INTRAVENOUS | Status: AC
  Administered 2014-04-17: 20:00:00 via INTRAVENOUS

## 2014-04-17 MED ORDER — ONDANSETRON HCL 4 MG/2ML IJ SOLN
4.0000 mg | Freq: Four times a day (QID) | INTRAMUSCULAR | Status: DC | PRN
  Administered 2014-04-17 – 2014-04-20 (×5): 4 mg via INTRAVENOUS
  Filled 2014-04-17 (×5): qty 2

## 2014-04-17 MED ORDER — ALPRAZOLAM 0.25 MG PO TABS
0.2500 mg | ORAL_TABLET | Freq: Every evening | ORAL | Status: DC | PRN
  Administered 2014-04-17 – 2014-04-19 (×2): 0.25 mg via ORAL
  Filled 2014-04-17 (×2): qty 1

## 2014-04-17 MED ORDER — SODIUM CHLORIDE 0.9 % IV SOLN
INTRAVENOUS | Status: DC
  Administered 2014-04-18 – 2014-04-19 (×3): via INTRAVENOUS
  Administered 2014-04-19: 1000 mL via INTRAVENOUS
  Administered 2014-04-20: 05:00:00 via INTRAVENOUS

## 2014-04-17 MED ORDER — ONDANSETRON HCL 4 MG/2ML IJ SOLN
4.0000 mg | Freq: Once | INTRAMUSCULAR | Status: AC
  Administered 2014-04-17: 4 mg via INTRAVENOUS
  Filled 2014-04-17: qty 2

## 2014-04-17 MED ORDER — ACETAMINOPHEN 650 MG RE SUPP
650.0000 mg | Freq: Four times a day (QID) | RECTAL | Status: DC | PRN
  Administered 2014-04-24 – 2014-04-27 (×6): 650 mg via RECTAL
  Filled 2014-04-17 (×9): qty 1

## 2014-04-17 MED ORDER — ACETAMINOPHEN 325 MG PO TABS
650.0000 mg | ORAL_TABLET | Freq: Four times a day (QID) | ORAL | Status: DC | PRN
  Administered 2014-04-18 – 2014-04-20 (×3): 650 mg via ORAL
  Filled 2014-04-17 (×4): qty 2

## 2014-04-17 MED ORDER — HYDROMORPHONE HCL PF 1 MG/ML IJ SOLN
0.5000 mg | INTRAMUSCULAR | Status: DC | PRN
  Administered 2014-04-17 – 2014-04-19 (×5): 0.5 mg via INTRAVENOUS
  Filled 2014-04-17 (×5): qty 1

## 2014-04-17 MED ORDER — ENOXAPARIN SODIUM 40 MG/0.4ML ~~LOC~~ SOLN
40.0000 mg | SUBCUTANEOUS | Status: DC
  Administered 2014-04-18: 40 mg via SUBCUTANEOUS
  Filled 2014-04-17 (×3): qty 0.4

## 2014-04-17 MED ORDER — MORPHINE SULFATE 4 MG/ML IJ SOLN
4.0000 mg | Freq: Once | INTRAMUSCULAR | Status: AC
  Administered 2014-04-17: 4 mg via INTRAVENOUS
  Filled 2014-04-17: qty 1

## 2014-04-17 MED ORDER — FLUTICASONE PROPIONATE 50 MCG/ACT NA SUSP
1.0000 | Freq: Every day | NASAL | Status: DC | PRN
  Filled 2014-04-17: qty 16

## 2014-04-17 MED ORDER — ONDANSETRON HCL 4 MG PO TABS: 4.0000 mg | ORAL_TABLET | Freq: Four times a day (QID) | ORAL | Status: DC | PRN

## 2014-04-17 NOTE — ED Notes (Addendum)
Pt reports sudden onset of abdominal pain x1 hour. Pain 8/10. Reports nausea, denies v/d. Denies dysuria. Reports she has had episodes like this in the past and possibly has gall bladder issues. Last bowel movement today. Pt ate meal 30 min before pain, mashed potatoes, roast, and squash. Pt had spinal tap yesterday is being tested for MS (because of numbness and tingling in hands, swelling in left foot.)

## 2014-04-17 NOTE — ED Provider Notes (Signed)
Spoke with Dr. Evette CristalGanem from gastroenterology. He will consult on the case. Requested medicine admissions. Spoke with Dr.Abrol and admit medical surgical floor Results for orders placed during the hospital encounter of 04/17/14  CBC WITH DIFFERENTIAL      Result Value Ref Range   WBC 9.9  4.0 - 10.5 K/uL   RBC 4.47  3.87 - 5.11 MIL/uL   Hemoglobin 14.1  12.0 - 15.0 g/dL   HCT 16.140.3  09.636.0 - 04.546.0 %   MCV 90.2  78.0 - 100.0 fL   MCH 31.5  26.0 - 34.0 pg   MCHC 35.0  30.0 - 36.0 g/dL   RDW 40.912.4  81.111.5 - 91.415.5 %   Platelets 196  150 - 400 K/uL   Neutrophils Relative % 81 (*) 43 - 77 %   Neutro Abs 8.1 (*) 1.7 - 7.7 K/uL   Lymphocytes Relative 12  12 - 46 %   Lymphs Abs 1.2  0.7 - 4.0 K/uL   Monocytes Relative 6  3 - 12 %   Monocytes Absolute 0.5  0.1 - 1.0 K/uL   Eosinophils Relative 1  0 - 5 %   Eosinophils Absolute 0.1  0.0 - 0.7 K/uL   Basophils Relative 0  0 - 1 %   Basophils Absolute 0.0  0.0 - 0.1 K/uL  COMPREHENSIVE METABOLIC PANEL      Result Value Ref Range   Sodium 137  137 - 147 mEq/L   Potassium 4.1  3.7 - 5.3 mEq/L   Chloride 95 (*) 96 - 112 mEq/L   CO2 27  19 - 32 mEq/L   Glucose, Bld 141 (*) 70 - 99 mg/dL   BUN 14  6 - 23 mg/dL   Creatinine, Ser 7.821.26 (*) 0.50 - 1.10 mg/dL   Calcium 9.3  8.4 - 95.610.5 mg/dL   Total Protein 6.9  6.0 - 8.3 g/dL   Albumin 3.8  3.5 - 5.2 g/dL   AST 213174 (*) 0 - 37 U/L   ALT 93 (*) 0 - 35 U/L   Alkaline Phosphatase 93  39 - 117 U/L   Total Bilirubin 0.8  0.3 - 1.2 mg/dL   GFR calc non Af Amer 46 (*) >90 mL/min   GFR calc Af Amer 54 (*) >90 mL/min   Anion gap 15  5 - 15  LIPASE, BLOOD      Result Value Ref Range   Lipase 43  11 - 59 U/L  URINALYSIS, ROUTINE W REFLEX MICROSCOPIC      Result Value Ref Range   Color, Urine YELLOW  YELLOW   APPearance CLEAR  CLEAR   Specific Gravity, Urine 1.014  1.005 - 1.030   pH 8.0  5.0 - 8.0   Glucose, UA NEGATIVE  NEGATIVE mg/dL   Hgb urine dipstick NEGATIVE  NEGATIVE   Bilirubin Urine NEGATIVE   NEGATIVE   Ketones, ur NEGATIVE  NEGATIVE mg/dL   Protein, ur NEGATIVE  NEGATIVE mg/dL   Urobilinogen, UA 0.2  0.0 - 1.0 mg/dL   Nitrite NEGATIVE  NEGATIVE   Leukocytes, UA TRACE (*) NEGATIVE  URINE MICROSCOPIC-ADD ON      Result Value Ref Range   Squamous Epithelial / LPF RARE  RARE   WBC, UA 3-6  <3 WBC/hpf   RBC / HPF 0-2  <3 RBC/hpf   Bacteria, UA RARE  RARE   Mr Sherrin DaisyBrain Wo Contrast  03/20/2014     [facility] [address], [address] [place], [place]  [address] [phone]  NEUROIMAGING REPORT   STUDY DATE: 03/19/2014 PATIENT NAME: Krista Chapman DOB: 02/12/1956 MRN: [id number]  ORDERING CLINICIAN: Dr Hosie Poisson CLINICAL HISTORY:  63 year lady with paresthesias COMPARISON FILMS:  CT Head 08/29/2013 EXAM: MRI Brain wo TECHNIQUE:MRI of the brain without contrast was obtained utilizing 5 mm  axial slices with T1, T2, T2 flair, T2 star gradient echo and diffusion  weighted views.  T1 sagittal and T2 coronal views were obtained.  CONTRAST: none IMAGING SITE: [facility]  FINDINGS: The brain parenchyma shows tiny scattered periventricular,  subcortical, periatrial and cerebellar nonspecific white matter  hyperintensities which may be seen in a variety of conditions including  demyelinating disease, vasculitis, small vessel disease, autoimmune,  inflammatory conditions. No other structural lesion, tumor infarcts are  noted.No abnormal lesions are seen on diffusion-weighted views to suggest  acute ischemia. The cortical sulci, fissures and cisterns are normal in  size and appearance. Lateral, third and fourth ventricle are normal in  size and appearance. No extra-axial fluid collections are seen. No  evidence of mass effect or midline shift.  On sagittal views the posterior  fossa, pituitary gland and corpus callosum are unremarkable. No evidence  of intracranial hemorrhage on gradient-echo views. The orbits and their  contents, paranasal sinuses and calvarium are unremarkable.  Intracranial   flow voids are present.     03/20/2014    Abnormal MRI scan the brain showing scattered tiny  Nonspecific periventricular, subcortical and periatrial white matter  hyperintensities with differential discussed above.    INTERPRETING PHYSICIAN:  Delia Heady, MD Certified in  Neuroimaging by American Society of Neuroimaging and [place]  [employer] Neurological Subspecialities    Mr Cervical Spine W Wo Contrast  04/02/2014   [facility]  NEUROIMAGING REPORT   STUDY DATE: 04/02/14 PATIENT NAME: Krista Chapman DOB: 1955-11-17 MRN: [id number]  ORDERING CLINICIAN: Elspeth Cho, DO  CLINICAL HISTORY: 58 year old female with numbness. EXAM: MRI brain (with and without)  TECHNIQUE: MRI of the cervical spine was obtained utilizing 3 mm sagittal  slices from the posterior fossa down to the T3-4 level with T1, T2 and  inversion recovery views. In addition 4 mm axial slices from C2-3 down to  T1-2 level were included with T2 and gradient echo views. CONTRAST: 41ml magnevist IMAGING SITE: [facility] [address] (1.5 Tesla MRI)   FINDINGS:  On sagittal views the vertebral bodies have normal height and alignment.   Degenerative spondylosis and disc bulging from C3-4 to C6-7. The spinal  cord is normal in size and appearance. The posterior fossa, pituitary  gland and paraspinal soft tissues are unremarkable.   On axial views: C2-3: no spinal stenosis or foraminal narrowing C3-4: disc bulging with no spinal stenosis or foraminal narrowing  C4-5: no spinal stenosis or foraminal narrowing  C5-6: uncovertebral joint hypertrophy and facet hypertrophy with moderate  left foraminal stenosis  C6-7: uncovertebral joint hypertrophy with mild left foraminal stenosis  C7-T1: no spinal stenosis or foraminal narrowing  T1-2: no spinal stenosis or foraminal narrowing   Limited views of the soft tissues of the head and neck are unremarkable.  No abnormal enhancing lesions.    04/02/2014   Abnormal MRI cervical spine (with and  without) demonstrating: 1. At C5-6: uncovertebral joint hypertrophy and facet hypertrophy with  moderate left foraminal stenosis. 2. At C6-7: uncovertebral joint hypertrophy with mild left foraminal  stenosis. 3. No intrinsic or abnormal enhancing lesions.    INTERPRETING PHYSICIAN:  Suanne Marker, MD Certified in Neurology, Neurophysiology and Neuroimaging  [facility] [address], [address] [place], [place] [address] [phone]   US Abdomen Complete  04/17/2014   CLINICAL DATA:  Right upper quadrant pain  EXAM: ULTRASOUND ABDOMEN COMPLETE  COMPARISON:  Ultrasound of June 09, 2010.  FINDINGS: Gallbladder:  The gallbladder is adequately distended and contains multiple echogenic mobile shadowing stones. There is no gallbladder wall thickening or pericholecystic fluid. There is no positive sonographic Murphy's sign.  Common bile duct:  Diameter: The common bile duct is dilated to 10.7 mm and there is a 5 mm shadowing stone within the duct.  Liver:  No focal lesion identified. Within normal limits in parenchymal echogenicity.  IVC:  No abnormality visualized.  Pancreas:  Visualized portion unremarkable.  Spleen:  Size and appearance within normal limits.  Right Kidney:  Length: 9 cm. Echogenicity within normal limits. No mass or hydronephrosis visualized.  Left Kidney:  Length: 9.3 cm. Echogenicity within normal limits. No mass or hydronephrosis visualized.  Abdominal aorta:  No aneurysm visualized.  Other findings:  None.  IMPRESSION: There are multiple mobile gallstones without sonographic evidence of acute cholecystitis. There is a 5mm common bile duct stone and the common bile duct is dilated to 10.7 mm.  These results were called by telephone at the time of interpretation on 04/17/2014 at 2:59 PM to Dr. Santiago Glad , who verbally acknowledged these results.   Electronically Signed   By: David  [place]   On: 04/17/2014 15:00   Dg Fluoro Guide Lumbar Puncture  04/16/2014    CLINICAL DATA:  Abnormal MRI the brain. Multiple sclerosis. Numbness in the hands. Visual disturbances.  EXAM: DIAGNOSTIC LUMBAR PUNCTURE UNDER FLUOROSCOPIC GUIDANCE  FLUOROSCOPY TIME:  18 seconds  PROCEDURE: Informed consent was obtained from the patient prior to the procedure, including potential complications of headache, allergy, and pain. With the patient prone, the lower back was prepped with Betadine. 1% Lidocaine was used for local anesthesia. Lumbar puncture was performed at the right paramedian L2-3 level using a 20 gauge needle with return of clear CSF with an opening pressure of 16.5 cm water. 12.41ml of CSF were obtained for laboratory studies. The patient tolerated the procedure well and there were no apparent complications.  IMPRESSION: Technically successful fluoroscopic guided lumbar puncture at the L2-3 level from a right paramedian approach.  Normal opening pressure. Clear fluid. 12.0 mL of CSF was obtained for laboratory studies.   Electronically Signed   By: Gennette Pac M.D.   On: 04/16/2014 10:14     Doug Sou, MD 04/18/14 [phone]

## 2014-04-17 NOTE — ED Provider Notes (Signed)
Complains of epigastric pain with increasing frequency over the past one week. Onset today point 11 AM after eating roast beef and asked potatoes. She feels much improved since treatment with morphine with morphine and Zofran here.  Doug Sou, MD 04/17/14 [phone]

## 2014-04-17 NOTE — ED Notes (Signed)
Ultrasound in room

## 2014-04-17 NOTE — ED Provider Notes (Signed)
CSN: [id number]     Arrival date & time 04/17/14  1344 History   First MD Initiated Contact with Patient 04/17/14 1457     Chief Complaint  Patient presents with  . Abdominal Pain   HPI Comments: Patient was told 3-4 years ago that her pain was due to gallstone attacks.  She has never been seen or evaluated by a surgeon.    Patient is a 58 y.o. female presenting with abdominal pain. The history is provided by the patient. No language interpreter was used.  Abdominal Pain Pain location:  RUQ Pain quality: sharp and stabbing   Pain radiates to:  Back Pain severity:  Severe Onset quality:  Sudden Duration:  3 hours Timing:  Intermittent Progression:  Worsening Chronicity:  Recurrent (Has had intermittent attacks after eating once a week for the past two years.) Context: eating   Context: not alcohol use, not awakening from sleep, not diet changes, not laxative use, not medication withdrawal, not previous surgeries, not recent illness, not recent sexual activity, not recent travel, not retching, not sick contacts, not suspicious food intake and not trauma   Relieved by:  Nothing Worsened by:  Eating Ineffective treatments:  Heat and OTC medications Associated symptoms: nausea   Associated symptoms: no anorexia, no belching, no chest pain, no chills, no constipation, no cough, no diarrhea, no dysuria, no fatigue, no fever, no flatus, no hematemesis, no hematochezia, no hematuria, no melena, no shortness of breath, no sore throat, no vaginal bleeding, no vaginal discharge and no vomiting   Associated symptoms comment:  Loose stools  Nausea:    Severity:  Moderate   Onset quality:  Sudden   Duration:  3 hours   Timing:  Constant   Progression:  Partially resolved Risk factors: obesity   Risk factors: no alcohol abuse, no aspirin use, not elderly, has not had multiple surgeries, no NSAID use, not pregnant and no recent hospitalization     Past Medical History  Diagnosis Date  .  Gallstones   . Palpitation   . Constipation   . Hypothyroidism    Past Surgical History  Procedure Laterality Date  . Ankle surgery Left 2004  . Colonoscopy     Family History  Problem Relation Age of Onset  . Emphysema Father   . Heart disease Mother   . Diabetes Father    History  Substance Use Topics  . Smoking status: Former Smoker -- 0.10 packs/day for 3 years    Types: Cigarettes    Quit date: 10/09/1988  . Smokeless tobacco: Never Used  . Alcohol Use: Yes     Comment: socially   OB History   Grav Para Term Preterm Abortions TAB SAB Ect Mult Living                 Review of Systems  Constitutional: Negative for fever, chills and fatigue.  HENT: Negative for sore throat.   Respiratory: Negative for cough and shortness of breath.   Cardiovascular: Negative for chest pain.  Gastrointestinal: Positive for nausea and abdominal pain. Negative for vomiting, diarrhea, constipation, melena, hematochezia, anorexia, flatus and hematemesis.  Genitourinary: Negative for dysuria, hematuria, vaginal bleeding and vaginal discharge.      Allergies  Codeine  Home Medications   Prior to Admission medications   Medication Sig Start Date End Date Taking? Authorizing Provider  ALPRAZolam Prudy Feeler(XANAX) 0.25 MG tablet Take 0.25 mg by mouth at bedtime as needed for anxiety.   Yes Historical Provider, MD  amLODipine (  NORVASC) 2.5 MG tablet TAKE 1 TABLET (2.5 MG TOTAL) BY MOUTH DAILY. <must make appointment at Dr. Hazle Coca office for future refills> 01/06/14  Yes Runell Gess, MD  Aspirin-Acetaminophen (GOODYS BODY PAIN PO) Take 2 each by mouth daily as needed (body aches).   Yes Historical Provider, MD  Calcium Carbonate-Vitamin D (CALCIUM 600+D HIGH POTENCY) 600-400 MG-UNIT per tablet Take 1 tablet by mouth daily.   Yes Historical Provider, MD  cholecalciferol (VITAMIN D) 1000 UNITS tablet Take 10,000 Units by mouth daily.    Yes Historical Provider, MD  fluticasone (FLONASE) 50 MCG/ACT  nasal spray Place 1 spray into both nostrils daily as needed.    Yes Historical Provider, MD  MILK THISTLE EXTRACT PO Take 3 tablets by mouth daily.   Yes Historical Provider, MD  Omega-3 Fatty Acids (FISH OIL PO) Take by mouth.   Yes Historical Provider, MD   BP 110/69  Pulse 62  Temp(Src) 97.5 F (36.4 C) (Oral)  Resp 18  SpO2 99% Physical Exam  ED Course  Procedures (including critical care time) Labs Review Labs Reviewed  CBC WITH DIFFERENTIAL - Abnormal; Notable for the following:    Neutrophils Relative % 81 (*)    Neutro Abs 8.1 (*)    All other components within normal limits  COMPREHENSIVE METABOLIC PANEL - Abnormal; Notable for the following:    Chloride 95 (*)    Glucose, Bld 141 (*)    Creatinine, Ser 1.26 (*)    AST 174 (*)    ALT 93 (*)    GFR calc non Af Amer 46 (*)    GFR calc Af Amer 54 (*)    All other components within normal limits  URINALYSIS, ROUTINE W REFLEX MICROSCOPIC - Abnormal; Notable for the following:    Leukocytes, UA TRACE (*)    All other components within normal limits  LIPASE, BLOOD  URINE MICROSCOPIC-ADD ON    Imaging Review US Abdomen Complete  04/17/2014   CLINICAL DATA:  Right upper quadrant pain  EXAM: ULTRASOUND ABDOMEN COMPLETE  COMPARISON:  Ultrasound of June 09, 2010.  FINDINGS: Gallbladder:  The gallbladder is adequately distended and contains multiple echogenic mobile shadowing stones. There is no gallbladder wall thickening or pericholecystic fluid. There is no positive sonographic Murphy's sign.  Common bile duct:  Diameter: The common bile duct is dilated to 10.7 mm and there is a 5 mm shadowing stone within the duct.  Liver:  No focal lesion identified. Within normal limits in parenchymal echogenicity.  IVC:  No abnormality visualized.  Pancreas:  Visualized portion unremarkable.  Spleen:  Size and appearance within normal limits.  Right Kidney:  Length: 9 cm. Echogenicity within normal limits. No mass or hydronephrosis  visualized.  Left Kidney:  Length: 9.3 cm. Echogenicity within normal limits. No mass or hydronephrosis visualized.  Abdominal aorta:  No aneurysm visualized.  Other findings:  None.  IMPRESSION: There are multiple mobile gallstones without sonographic evidence of acute cholecystitis. There is a 5mm common bile duct stone and the common bile duct is dilated to 10.7 mm.  These results were called by telephone at the time of interpretation on 04/17/2014 at 2:59 PM to Dr. Santiago Glad , who verbally acknowledged these results.   Electronically Signed   By: David  [place]   On: 04/17/2014 15:00   Dg Fluoro Guide Lumbar Puncture  04/16/2014   CLINICAL DATA:  Abnormal MRI the brain. Multiple sclerosis. Numbness in the hands. Visual disturbances.  EXAM: DIAGNOSTIC LUMBAR PUNCTURE  UNDER FLUOROSCOPIC GUIDANCE  FLUOROSCOPY TIME:  18 seconds  PROCEDURE: Informed consent was obtained from the patient prior to the procedure, including potential complications of headache, allergy, and pain. With the patient prone, the lower back was prepped with Betadine. 1% Lidocaine was used for local anesthesia. Lumbar puncture was performed at the right paramedian L2-3 level using a 20 gauge needle with return of clear CSF with an opening pressure of 16.5 cm water. 12.52ml of CSF were obtained for laboratory studies. The patient tolerated the procedure well and there were no apparent complications.  IMPRESSION: Technically successful fluoroscopic guided lumbar puncture at the L2-3 level from a right paramedian approach.  Normal opening pressure. Clear fluid. 12.0 mL of CSF was obtained for laboratory studies.   Electronically Signed   By: Gennette Pac M.D.   On: 04/16/2014 10:14     EKG Interpretation None      MDM   Final diagnoses:  Common bile duct stone   Patient is a 58 y.o. Female who presents to the ED with RUQ pain.  CBC, UA, and lipase were unremarkable. CMP shows elevation of AST and ALT.  RUQ ultrasound of the  abdomen shows dilitation of the common bile duct to 10.7 mm and shoes a stone in the common bile duct.  Patient was treated here with morphine and zofran and currently is pain free.  Dr. Ethelda Chick was made aware of the patient and has seen the patient.  He has called GI at this time for an ERCP and will be admitted to the Med-surg floor by medicine at this time.     Eben Burow, PA-C 04/17/14 1725

## 2014-04-17 NOTE — ED Provider Notes (Signed)
  Medical Screening Exam Krista Chapman is a 58 y.o. female who presents to the Emergency Department complaining of intermittent, moderate RUQ abdominal pain x 1 hour after eating. Pt also states pain radiates around to her mid back. She initially noted these symptoms about 3 years ago but have noted episodes coming more frequently. Pt states episodes come about once weekly lasting for several minutes to hours at a time. Pt was treated in the ED with last episode and was told symptoms were related to a "gallbadder attack". Current pain is exacerbated with palpation and deep breathing. Symptoms alleviated when leaning forward. At this time she reports associated nausea.  She denies any vomiting, fever, chest pain, or SOB.   PHYSICAL EXAMINATION-  Abdomen: Soft Tenderness to palpation over RUQ and epigastric area Positive Murphy's sign  PLAN: Will order Morphine and Zofran RUQ abdominal ultrasound to assess for Cholecystitis.  Remainder of the work up will be done by another provider.  This exam was done as a Medical Screening Exam.  Santiago Glad, PA-C 04/17/14 2216

## 2014-04-17 NOTE — H&P (Addendum)
[facility] History and Physical  Lyman BishopFaye B Go ZOX:[id number] DOB: 06/11/1956 DOA: 04/17/2014  Referring physician: ER  PCP: Neldon LabellaMILLER,LISA LYNN, MD   Chief Complaint: Epigastric pain  HPI:  58 y.o. female who presents to the Emergency Department complaining of intermittent, moderate RUQ abdominal pain x 1 hour after eating. Pt also states pain radiates around to her mid back. She initially noted these symptoms about 3 years ago but have noted episodes coming more frequently. Pt states episodes come about once weekly lasting for several minutes to hours at a time. Pt was treated in the ED with last episode and was told symptoms were related to a "gallbadder attack". Current pain is exacerbated with palpation and deep breathing. Symptoms alleviated when leaning forward. At this time she denies any nausea, vomiting, fever, chest pain, or SOB. She has been using milk thistle and lemon water as a part of a GI cleansing routine.   Pt mentions undergoing a spinal tap yesterday to rule out MS. However, pain is not located at injection site, nor does she associate symptoms with procedure.  Patient is [facility] for intermittent distal lower extremity pain and burning sensation. She also notices tingling in the tips of her fingers occasionally. The patient has had EMG and nerve conduction studies, MRI of the brain and C-spine and lumbar puncture part of her workup. No history of cardiopulmonary issues     Review of Systems: negative for the following  Constitutional: Denies fever, chills, diaphoresis, appetite change and fatigue.  HEENT: Denies photophobia, eye pain, redness, hearing loss, ear pain, congestion, sore throat, rhinorrhea, sneezing, mouth sores, trouble swallowing, neck pain, neck stiffness and tinnitus.  Respiratory: Denies SOB, DOE, cough, chest tightness, and wheezing.  Cardiovascular: Denies chest pain, palpitations and leg  swelling.  Gastrointestinal: Positive for nausea and abdominal pain. Negative for vomiting, diarrhea, constipation, melena, hematochezia, anorexia, flatus and hematemesis.  Genitourinary: Denies dysuria, urgency, frequency, hematuria, flank pain and difficulty urinating.  Musculoskeletal: Denies myalgias, back pain, joint swelling, arthralgias and gait problem.  Skin: Denies pallor, rash and wound.  Neurological: Denies dizziness, seizures, syncope, weakness, light-headedness, numbness and headaches.  Hematological: Denies adenopathy. Easy bruising, personal or family bleeding history  Psychiatric/Behavioral: Denies suicidal ideation, mood changes, confusion, nervousness, sleep disturbance and agitation       Past Medical History  Diagnosis Date  . Gallstones   . Palpitation   . Constipation   . Hypothyroidism      Past Surgical History  Procedure Laterality Date  . Ankle surgery Left 2004  . Colonoscopy        Social History:  reports that she quit smoking about 25 years ago. Her smoking use included Cigarettes. She has a .3 pack-year smoking history. She has never used smokeless tobacco. She reports that she drinks alcohol. She reports that she does not use illicit drugs.    Allergies  Allergen Reactions  . Codeine Nausea Only    Family History  Problem Relation Age of Onset  . Emphysema Father   . Heart disease Mother   . Diabetes Father      Prior to Admission medications   Medication Sig Start Date End Date Taking? Authorizing Provider  ALPRAZolam Prudy Feeler(XANAX) 0.25 MG tablet Take 0.25 mg by mouth at bedtime as needed for anxiety.   Yes Historical Provider, MD  amLODipine (NORVASC) 2.5 MG tablet TAKE 1 TABLET (2.5 MG TOTAL) BY MOUTH DAILY. <must make appointment at Dr. Hazle CocaBerry's office for future  refills> 01/06/14  Yes Runell Gess, MD  Aspirin-Acetaminophen (GOODYS BODY PAIN PO) Take 2 each by mouth daily as needed (body aches).   Yes Historical Provider, MD   Calcium Carbonate-Vitamin D (CALCIUM 600+D HIGH POTENCY) 600-400 MG-UNIT per tablet Take 1 tablet by mouth daily.   Yes Historical Provider, MD  cholecalciferol (VITAMIN D) 1000 UNITS tablet Take 10,000 Units by mouth daily.    Yes Historical Provider, MD  fluticasone (FLONASE) 50 MCG/ACT nasal spray Place 1 spray into both nostrils daily as needed.    Yes Historical Provider, MD  MILK THISTLE EXTRACT PO Take 3 tablets by mouth daily.   Yes Historical Provider, MD  Omega-3 Fatty Acids (FISH OIL PO) Take by mouth.   Yes Historical Provider, MD     Physical Exam: Filed Vitals:   04/17/14 1351 04/17/14 1604  BP: 119/87 110/69  Pulse: 67 62  Temp: 97.5 F (36.4 C)   TempSrc: Oral   Resp: 18 18  SpO2: 100% 99%     Constitutional: Vital signs reviewed. Patient is a well-developed and well-nourished in no acute distress and cooperative with exam. Alert and oriented x3.  Head: Normocephalic and atraumatic  Ear: TM normal bilaterally  Mouth: no erythema or exudates, MMM  Eyes: PERRL, EOMI, conjunctivae normal, No scleral icterus.  Neck: Supple, Trachea midline normal ROM, No JVD, mass, thyromegaly, or carotid bruit present.  Cardiovascular: RRR, S1 normal, S2 normal, no MRG, pulses symmetric and intact bilaterally  Pulmonary/Chest: CTAB, no wheezes, rales, or rhonchi  Abdominal: Soft. Non-tender, non-distended, bowel sounds are normal, no masses, organomegaly, or guarding present.  GU: no CVA tenderness Musculoskeletal: No joint deformities, erythema, or stiffness, ROM full and no nontender Ext: no edema and no cyanosis, pulses palpable bilaterally (DP and PT)  Hematology: no cervical, inginal, or axillary adenopathy.  Neurological: A&O x3, Strenght is normal and symmetric bilaterally, cranial nerve II-XII are grossly intact, no focal motor deficit, sensory intact to light touch bilaterally.  Skin: Warm, dry and intact. No rash, cyanosis, or clubbing.  Psychiatric: Normal mood and affect.  speech and behavior is normal. Judgment and thought content normal. Cognition and memory are normal.       Labs on Admission:    Basic Metabolic Panel:  Recent Labs Lab 04/17/14 1457  NA 137  K 4.1  CL 95*  CO2 27  GLUCOSE 141*  BUN 14  CREATININE 1.26*  CALCIUM 9.3   Liver Function Tests:  Recent Labs Lab 04/17/14 1457  AST 174*  ALT 93*  ALKPHOS 93  BILITOT 0.8  PROT 6.9  ALBUMIN 3.8    Recent Labs Lab 04/17/14 1457  LIPASE 43   No results found for this basename: AMMONIA,  in the last 168 hours CBC:  Recent Labs Lab 04/17/14 1457  WBC 9.9  NEUTROABS 8.1*  HGB 14.1  HCT 40.3  MCV 90.2  PLT 196   Cardiac Enzymes: No results found for this basename: CKTOTAL, CKMB, CKMBINDEX, TROPONINI,  in the last 168 hours  BNP (last 3 results) No results found for this basename: PROBNP,  in the last 8760 hours    CBG: No results found for this basename: GLUCAP,  in the last 168 hours  Radiological Exams on Admission: US Abdomen Complete  04/17/2014   CLINICAL DATA:  Right upper quadrant pain  EXAM: ULTRASOUND ABDOMEN COMPLETE  COMPARISON:  Ultrasound of June 09, 2010.  FINDINGS: Gallbladder:  The gallbladder is adequately distended and contains multiple echogenic mobile shadowing stones.  There is no gallbladder wall thickening or pericholecystic fluid. There is no positive sonographic Murphy's sign.  Common bile duct:  Diameter: The common bile duct is dilated to 10.7 mm and there is a 5 mm shadowing stone within the duct.  Liver:  No focal lesion identified. Within normal limits in parenchymal echogenicity.  IVC:  No abnormality visualized.  Pancreas:  Visualized portion unremarkable.  Spleen:  Size and appearance within normal limits.  Right Kidney:  Length: 9 cm. Echogenicity within normal limits. No mass or hydronephrosis visualized.  Left Kidney:  Length: 9.3 cm. Echogenicity within normal limits. No mass or hydronephrosis visualized.  Abdominal aorta:   No aneurysm visualized.  Other findings:  None.  IMPRESSION: There are multiple mobile gallstones without sonographic evidence of acute cholecystitis. There is a 5mm common bile duct stone and the common bile duct is dilated to 10.7 mm.  These results were called by telephone at the time of interpretation on 04/17/2014 at 2:59 PM to Dr. Santiago Glad , who verbally acknowledged these results.   Electronically Signed   By: David  [place]   On: 04/17/2014 15:00   Dg Fluoro Guide Lumbar Puncture  04/16/2014   CLINICAL DATA:  Abnormal MRI the brain. Multiple sclerosis. Numbness in the hands. Visual disturbances.  EXAM: DIAGNOSTIC LUMBAR PUNCTURE UNDER FLUOROSCOPIC GUIDANCE  FLUOROSCOPY TIME:  18 seconds  PROCEDURE: Informed consent was obtained from the patient prior to the procedure, including potential complications of headache, allergy, and pain. With the patient prone, the lower back was prepped with Betadine. 1% Lidocaine was used for local anesthesia. Lumbar puncture was performed at the right paramedian L2-3 level using a 20 gauge needle with return of clear CSF with an opening pressure of 16.5 cm water. 12.35ml of CSF were obtained for laboratory studies. The patient tolerated the procedure well and there were no apparent complications.  IMPRESSION: Technically successful fluoroscopic guided lumbar puncture at the L2-3 level from a right paramedian approach.  Normal opening pressure. Clear fluid. 12.0 mL of CSF was obtained for laboratory studies.   Electronically Signed   By: Gennette Pac M.D.   On: 04/16/2014 10:14    EKG: Independently reviewed.   Assessment/Plan Active Problems:   Common bile duct stone   Choledocholithiasis   Choledocholithiasis Doug Sou, MD, spoke with Dr. Evette Cristal from gastroenterology for possible ERCP Patient being admitted to medical/surgical floor Liver Function mildly elevated, lipase normal, WBC count normal Patient has multiple questions about the timing of  ERCP/possible cholecystectomy Explained to her that gastroenterology will answer these questions for her in detail Clear liquid diet tonight if okay with gastroenterology N.p.o. past midnight  Patient reports having a colonoscopy 6 years ago  paresthesias/possible multiple sclerosis Status post lumbar puncture yesterday Followed by [facility] MRI C spine degenerative changes but no findings consistent with MS MRI brain findings were non-specific and could be caused by multiple different factors, including MS   Anxiety Continue with Xanax   Code Status:   full Family Communication: bedside Disposition Plan: admit   Time spent: 70 mins   [facility] Pager [phone]  If 7PM-7AM, please contact night-coverage [email or web address] Password [email or web address] 04/17/2014, 6:18 PM

## 2014-04-18 ENCOUNTER — Encounter (HOSPITAL_COMMUNITY): Payer: Self-pay | Admitting: *Deleted

## 2014-04-18 DIAGNOSIS — K8020 Calculus of gallbladder without cholecystitis without obstruction: Secondary | ICD-10-CM

## 2014-04-18 DIAGNOSIS — N179 Acute kidney failure, unspecified: Secondary | ICD-10-CM

## 2014-04-18 DIAGNOSIS — R1011 Right upper quadrant pain: Secondary | ICD-10-CM

## 2014-04-18 DIAGNOSIS — R1013 Epigastric pain: Secondary | ICD-10-CM

## 2014-04-18 LAB — CBC
HCT: 36.7 % (ref 36.0–46.0)
HEMOGLOBIN: 12.8 g/dL (ref 12.0–15.0)
MCH: 31.7 pg (ref 26.0–34.0)
MCHC: 34.9 g/dL (ref 30.0–36.0)
MCV: 90.8 fL (ref 78.0–100.0)
PLATELETS: 170 10*3/uL (ref 150–400)
RBC: 4.04 MIL/uL (ref 3.87–5.11)
RDW: 12.5 % (ref 11.5–15.5)
WBC: 5.5 10*3/uL (ref 4.0–10.5)

## 2014-04-18 LAB — COMPREHENSIVE METABOLIC PANEL
ALT: 339 U/L — ABNORMAL HIGH (ref 0–35)
ANION GAP: 12 (ref 5–15)
AST: 347 U/L — ABNORMAL HIGH (ref 0–37)
Albumin: 3.2 g/dL — ABNORMAL LOW (ref 3.5–5.2)
Alkaline Phosphatase: 104 U/L (ref 39–117)
BUN: 11 mg/dL (ref 6–23)
CO2: 24 meq/L (ref 19–32)
CREATININE: 1.12 mg/dL — AB (ref 0.50–1.10)
Calcium: 9 mg/dL (ref 8.4–10.5)
Chloride: 105 mEq/L (ref 96–112)
GFR calc Af Amer: 62 mL/min — ABNORMAL LOW (ref >90)
GFR, EST NON AFRICAN AMERICAN: 53 mL/min — AB (ref >90)
GLUCOSE: 88 mg/dL (ref 70–99)
Potassium: 4.5 mEq/L (ref 3.7–5.3)
SODIUM: 141 meq/L (ref 137–147)
Total Bilirubin: 0.8 mg/dL (ref 0.3–1.2)
Total Protein: 6 g/dL (ref 6.0–8.3)

## 2014-04-18 LAB — MAGNESIUM: MAGNESIUM: 2 mg/dL (ref 1.5–2.5)

## 2014-04-18 MED ORDER — KETOROLAC TROMETHAMINE 15 MG/ML IJ SOLN: 15.0000 mg | Freq: Once | INTRAMUSCULAR | Status: DC

## 2014-04-18 NOTE — ED Provider Notes (Signed)
Medical screening examination/treatment/procedure(s) were conducted as a shared visit with non-physician practitioner(s) and myself.  I personally evaluated the patient during the encounter.   EKG Interpretation None       Doug SouSam Koryn Charlot, MD 04/18/14 [phone]

## 2014-04-18 NOTE — Consult Note (Signed)
[facility] Gastroenterology Consultation Note  Referring Provider:  Dr. Manson Passey [facility]) Primary Care Physician:  Neldon Labella, MD Primary Gastroenterologist:  Dr. Vida Rigger  Reason for Consultation:  Abdominal pain, elevated LFTs, choledocholithiasis  HPI: Krista Chapman is a 58 y.o. female seen for evaluation of abdominal pain and abnormal imaging studies.  For the past 4 years, patient has had intermittent discrete attacks of epigastric abdominal pain with radiation to the back.  Symptoms progressively worsening over the past couple months, with an especially bad attack last night.  She had liver test elevations, AST/ALT with normal bilirubin, abdominal ultrasound showed gallstones without cholecystitis, dilated bile duct (11mm) with 5mm bile duct stone.  Patient had recent lumbar puncture for evaluation of various neurologic complaints with concern for multiple sclerosis.  No nausea, vomiting, blood in stool, fevers.  She is comfortable at present.   Past Medical History  Diagnosis Date  . Gallstones   . Palpitation   . Constipation   . Hypothyroidism     Past Surgical History  Procedure Laterality Date  . Ankle surgery Left 2004  . Colonoscopy      Prior to Admission medications   Medication Sig Start Date End Date Taking? Authorizing Provider  ALPRAZolam Prudy Feeler) 0.25 MG tablet Take 0.25 mg by mouth at bedtime as needed for anxiety.   Yes Historical Provider, MD  amLODipine (NORVASC) 2.5 MG tablet TAKE 1 TABLET (2.5 MG TOTAL) BY MOUTH DAILY. <must make appointment at Dr. Hazle Coca office for future refills> 01/06/14  Yes Runell Gess, MD  Aspirin-Acetaminophen (GOODYS BODY PAIN PO) Take 2 each by mouth daily as needed (body aches).   Yes Historical Provider, MD  Calcium Carbonate-Vitamin D (CALCIUM 600+D HIGH POTENCY) 600-400 MG-UNIT per tablet Take 1 tablet by mouth daily.   Yes Historical Provider, MD  cholecalciferol (VITAMIN D) 1000 UNITS tablet Take 10,000 Units by mouth  daily.    Yes Historical Provider, MD  fluticasone (FLONASE) 50 MCG/ACT nasal spray Place 1 spray into both nostrils daily as needed.    Yes Historical Provider, MD  MILK THISTLE EXTRACT PO Take 3 tablets by mouth daily.   Yes Historical Provider, MD  Omega-3 Fatty Acids (FISH OIL PO) Take by mouth.   Yes Historical Provider, MD    Current Facility-Administered Medications  Medication Dose Route Frequency Provider Last Rate Last Dose  . 0.9 %  sodium chloride infusion   Intravenous Continuous Richarda Overlie, MD 75 mL/hr at 04/18/14 0352    . acetaminophen (TYLENOL) tablet 650 mg  650 mg Oral Q6H PRN Richarda Overlie, MD       Or  . acetaminophen (TYLENOL) suppository 650 mg  650 mg Rectal Q6H PRN Richarda Overlie, MD      . ALPRAZolam Prudy Feeler) tablet 0.25 mg  0.25 mg Oral QHS PRN Richarda Overlie, MD   0.25 mg at 04/17/14 2222  . enoxaparin (LOVENOX) injection 40 mg  40 mg Subcutaneous Q24H Richarda Overlie, MD      . fluticasone (FLONASE) 50 MCG/ACT nasal spray 1 spray  1 spray Each Nare Daily PRN Richarda Overlie, MD      . HYDROmorphone (DILAUDID) injection 0.5 mg  0.5 mg Intravenous Q4H PRN Richarda Overlie, MD   0.5 mg at 04/18/14 0352  . ondansetron (ZOFRAN) tablet 4 mg  4 mg Oral Q6H PRN Richarda Overlie, MD       Or  . ondansetron (ZOFRAN) injection 4 mg  4 mg Intravenous Q6H PRN Richarda Overlie, MD  4 mg at 04/17/14 2127    Allergies as of 04/17/2014 - Review Complete 04/17/2014  Allergen Reaction Noted  . Codeine Nausea Only 09/01/2008    Family History  Problem Relation Age of Onset  . Emphysema Father   . Heart disease Mother   . Diabetes Father     History   Social History  . Marital Status: Single    Spouse Name: N/A    Number of Children: N/A  . Years of Education: N/A   Occupational History  . comsmet[employer]    Social History Main Topics  . Smoking status: Former Smoker -- 0.10 packs/day for 3 years    Types: Cigarettes    Quit date: 10/09/1988  . Smokeless tobacco: Never Used  .  Alcohol Use: Yes     Comment: socially  . Drug Use: No  . Sexual Activity: Not on file   Other Topics Concern  . Not on file   Social History Narrative   Single with 1 child   Right handed   High school graduate   1 cup daily    Review of Systems: ROS Dr. Susie CassetteAbrol reviewed from 04/17/14 and I agree  Physical Exam: Vital signs in last 24 hours: Temp:  [97.5 F (36.4 C)-98.5 F (36.9 C)] 97.9 F (36.6 C) (07/11 0500) Pulse Rate:  [51-67] 59 (07/11 0500) Resp:  [16-18] 16 (07/11 0500) BP: (106-130)/(61-87) 106/61 mmHg (07/11 0500) SpO2:  [98 %-100 %] 98 % (07/11 0500) Weight:  [77.111 kg (170 lb)] 77.111 kg (170 lb) (07/10 1835) Last BM Date: 04/17/14 General:   Alert,  Well-developed, well-nourished, pleasant and cooperative in NAD Head:  Normocephalic and atraumatic. Eyes:  Sclera clear, no icterus.   Conjunctiva pink. Ears:  Normal auditory acuity. Nose:  No deformity, discharge,  or lesions. Mouth:  No deformity or lesions.  Oropharynx pink & moist. Neck:  Supple; no masses or thyromegaly. Lungs:  Clear throughout to auscultation.   No wheezes, crackles, or rhonchi. No acute distress. Heart:  Regular rate and rhythm; no murmurs, clicks, rubs,  or gallops. Abdomen:  Soft, nontender and nondistended. No masses, hepatosplenomegaly or hernias noted. Normal bowel sounds, without guarding, and without rebound.     Msk:  Symmetrical without gross deformities. Normal posture. Pulses:  Normal pulses noted. Extremities:  Without clubbing or edema. Neurologic:  Alert and  oriented x4;  grossly normal neurologically. Skin:  Intact without significant lesions or rashes. Psych:  Alert and cooperative. Normal mood and affect.   Lab Results:  Recent Labs  04/17/14 1457 04/18/14 0403  WBC 9.9 5.5  HGB 14.1 12.8  HCT 40.3 36.7  PLT 196 170   BMET  Recent Labs  04/17/14 1457 04/18/14 0403  NA 137 141  K 4.1 4.5  CL 95* 105  CO2 27 24  GLUCOSE 141* 88  BUN 14 11   CREATININE 1.26* 1.12*  CALCIUM 9.3 9.0   LFT  Recent Labs  04/18/14 0403  PROT 6.0  ALBUMIN 3.2*  AST 347*  ALT 339*  ALKPHOS 104  BILITOT 0.8   PT/INR No results found for this basename: LABPROT, INR,  in the last 72 hours  Studies/Results: [place] Abdomen Complete  04/17/2014   CLINICAL DATA:  Right upper quadrant pain  EXAM: ULTRASOUND ABDOMEN COMPLETE  COMPARISON:  Ultrasound of June 09, 2010.  FINDINGS: Gallbladder:  The gallbladder is adequately distended and contains multiple echogenic mobile shadowing stones. There is no gallbladder wall thickening or pericholecystic fluid. There is  no positive sonographic Murphy's sign.  Common bile duct:  Diameter: The common bile duct is dilated to 10.7 mm and there is a 5 mm shadowing stone within the duct.  Liver:  No focal lesion identified. Within normal limits in parenchymal echogenicity.  IVC:  No abnormality visualized.  Pancreas:  Visualized portion unremarkable.  Spleen:  Size and appearance within normal limits.  Right Kidney:  Length: 9 cm. Echogenicity within normal limits. No mass or hydronephrosis visualized.  Left Kidney:  Length: 9.3 cm. Echogenicity within normal limits. No mass or hydronephrosis visualized.  Abdominal aorta:  No aneurysm visualized.  Other findings:  None.  IMPRESSION: There are multiple mobile gallstones without sonographic evidence of acute cholecystitis. There is a 5mm common bile duct stone and the common bile duct is dilated to 10.7 mm.  These results were called by telephone at the time of interpretation on 04/17/2014 at 2:59 PM to Dr. Santiago Glad , who verbally acknowledged these results.   Electronically Signed   By: David  [place]   On: 04/17/2014 15:00   Impression:  1.  Escalating biliary colic. Known cholelithiasis. 2.  Choledocholithiasis on abdominal ultrasound  3.  Elevated LFTs, without biliary obstruction.  Likely due to #2 above. 4.  Recent lumbar puncture, evaluation for multiple  sclerosis, doubt related to any of her above symptoms.  Plan:  1.  Clear liquids today, NPO after midnight. 2.  Plan for endoscopic retrograde cholangiopancreatography for anticipated biliary sphincterotomy and bile duct stone extraction, tomorrow in OR, ~ 930 in the morning. 3.  Risks (up to and including bleeding, infection, perforation, pancreatitis that can be complicated by infected necrosis and death), benefits (removal of stones, alleviating blockage, decreasing risk of cholangitis or choledocholithiasis-related pancreatitis), and alternatives (watchful waiting, percutaneous transhepatic cholangiography) of ERCP were explained to patient/family in detail and patient elects to proceed. 4.  Cholecystectomy after clearance of bile duct.   LOS: 1 day   Ledarius Leeson M  04/18/2014, 1:29 PM

## 2014-04-18 NOTE — Progress Notes (Signed)
Patient ID: Krista Chapman Frickey, female   DOB: 11/28/1955, 58 y.o.   MRN: [id number] [facility] PROGRESS NOTE  Krista Chapman Kargbo [id number] DOB: 07/31/1956 DOA: 04/17/2014 PCP: Neldon LabellaMILLER,LISA LYNN, MD  Brief narrative: 58 year old female with past medical history of anxiety, recurrent epigastric and right upper quadrant abdominal pain who [facility] with more frequent episodes of pain. She described pain, intermittent, about 6/10 in intensity and radiating to her back. She had nausea but no reports of vomiting. No fevers or chills. No reports of blood in stool or urine.  Assessment/Plan:  Active Problems: Abdominal pain / Choledocholithiasis - appreciate GI consult and recommendations; plan for ERCP over the weekend for possible stone extraction - surgery consulted but no role in surgery as of now since ERCP is preferred as sone visible in CBD  - keep NPO - continue IV fluids, analgesia as needed, antiemetics as needed  Acute renal failure - likely prerenal - continue IV fluids - follow up BMP in am   DVT prophylaxis: Lovenox sub Q while pt is in hospital  Code Status: full code  Family Communication: plan of care discussed with the patient and family at the bedside  Disposition Plan: home when stable   Manson PasseyEVINE, Solace Wendorff, MD  [facility] Pager [phone]  If 7PM-7AM, please contact night-coverage [email or web address] Password [email or web address] 04/18/2014, 12:22 PM   LOS: 1 day   Consultants:  Surgery   GI (Dr. Dulce Sellarutlaw)  Procedures:  None   Antibiotics:  None   HPI/Subjective: No acute overnight events.  Objective: Filed Vitals:   04/17/14 1604 04/17/14 1835 04/17/14 2122 04/18/14 0500  BP: 110/69 109/64 130/74 106/61  Pulse: 62 51 54 59  Temp:  98.5 F (36.9 C) 98.4 F (36.9 C) 97.9 F (36.6 C)  TempSrc:  Oral Oral Oral  Resp: 18 16 16 16   Height:  5\' 6"  (1.676 m)    Weight:  77.111 kg (170 lb)    SpO2: 99% 99% 100% 98%    Intake/Output Summary (Last 24  hours) at 04/18/14 1222 Last data filed at 04/18/14 0729  Gross per 24 hour  Intake    360 ml  Output    300 ml  Net     60 ml    Exam:   General:  Pt is alert, follows commands appropriately, not in acute distress  Cardiovascular: Regular rate and rhythm, S1/S2, no murmurs  Respiratory: Clear to auscultation bilaterally, no wheezing, no crackles, no rhonchi  Abdomen: RUQ tenderness to palpation, non distended, bowel sounds present  Extremities: No edema, pulses DP and PT palpable bilaterally  Neuro: Grossly nonfocal  Data Reviewed: Basic Metabolic Panel:  Recent Labs Lab 04/17/14 1457 04/18/14 0403  NA 137 141  K 4.1 4.5  CL 95* 105  CO2 27 24  GLUCOSE 141* 88  BUN 14 11  CREATININE 1.26* 1.12*  CALCIUM 9.3 9.0  MG  --  2.0   Liver Function Tests:  Recent Labs Lab 04/17/14 1457 04/18/14 0403  AST 174* 347*  ALT 93* 339*  ALKPHOS 93 104  BILITOT 0.8 0.8  PROT 6.9 6.0  ALBUMIN 3.8 3.2*    Recent Labs Lab 04/17/14 1457  LIPASE 43   No results found for this basename: AMMONIA,  in the last 168 hours CBC:  Recent Labs Lab 04/17/14 1457 04/18/14 0403  WBC 9.9 5.5  NEUTROABS 8.1*  --   HGB 14.1 12.8  HCT 40.3 36.7  MCV 90.2 90.8  PLT 196  170   Cardiac Enzymes: No results found for this basename: CKTOTAL, CKMB, CKMBINDEX, TROPONINI,  in the last 168 hours BNP: No components found with this basename: POCBNP,  CBG: No results found for this basename: GLUCAP,  in the last 168 hours  Recent Results (from the past 240 hour(s))  CSF CULTURE     Status: None   Collection Time    04/16/14  9:35 AM      Result Value Ref Range Status   Gram Stain No WBC Seen   Preliminary   Gram Stain No Organisms Seen   Preliminary   Preliminary Report NO GROWTH 1 DAY   Preliminary     Studies: US Abdomen Complete 04/17/2014    IMPRESSION: There are multiple mobile gallstones without sonographic evidence of acute cholecystitis. There is a 5mm common bile duct  stone and the common bile duct is dilated to 10.7 mm.  These results were called by telephone at the time of interpretation on 04/17/2014 at 2:59 PM to Dr. Santiago Glad , who verbally acknowledged these results.   Electronically Signed   By: David  [place]   On: 04/17/2014 15:00    Scheduled Meds: . enoxaparin (LOVENOX) injection  40 mg Subcutaneous Q24H   Continuous Infusions: . sodium chloride 75 mL/hr at 04/18/14 0352

## 2014-04-18 NOTE — ED Provider Notes (Signed)
Medical screening examination/treatment/procedure(s) were conducted as a shared visit with non-physician practitioner(s) and myself.  I personally evaluated the patient during the encounter.   EKG Interpretation None       Hendrik Donath, MD 04/18/14 0046 

## 2014-04-18 NOTE — Consult Note (Signed)
Reason for Consult:abdominal pain, gallstones Referring Physician: Kseniya Chapman is an 58 y.o. female.  HPI: patient is a 58 year old female who states that about 4 years ago she had an episode of epigastric and back pain and presented to the emergency department. She says she was told at that time that she had gallstones but they were not bad enough to do anything about. She did use some over-the-counter GI cleansing products and did well for about a year. She then began having episodes of upper abdominal pain that initially were not very frequent and more severe so she did not seek any further attention. However over the last month she has been having more frequent and severe episodes of pain. This culminated in a fairly severe episode yesterday that brought her to the emergency department. She describes pressure or cramping pain in her epigastrium and radiates around both rib cages through to her mid to upper back. Yesterday this lasted several hours and was relieved only by medications in the emergency department. No associated nausea or vomiting. No fever or chills. No jaundice noted. She has continued to have episodic pain since admission requiring pain medications. No fever or chills.  Past Medical History  Diagnosis Date  . Gallstones   . Palpitation   . Constipation   . Hypothyroidism     Past Surgical History  Procedure Laterality Date  . Ankle surgery Left 2004  . Colonoscopy      Family History  Problem Relation Age of Onset  . Emphysema Father   . Heart disease Mother   . Diabetes Father     Social History:  reports that she quit smoking about 25 years ago. Her smoking use included Cigarettes. She has a .3 pack-year smoking history. She has never used smokeless tobacco. She reports that she drinks alcohol. She reports that she does not use illicit drugs.  Allergies:  Allergies  Allergen Reactions  . Codeine Nausea Only    Current Facility-Administered  Medications  Medication Dose Route Frequency Provider Last Rate Last Dose  . 0.9 %  sodium chloride infusion   Intravenous Continuous Reyne Dumas, MD 75 mL/hr at 04/18/14 0352    . acetaminophen (TYLENOL) tablet 650 mg  650 mg Oral Q6H PRN Reyne Dumas, MD       Or  . acetaminophen (TYLENOL) suppository 650 mg  650 mg Rectal Q6H PRN Reyne Dumas, MD      . ALPRAZolam Duanne Moron) tablet 0.25 mg  0.25 mg Oral QHS PRN Reyne Dumas, MD   0.25 mg at 04/17/14 2222  . enoxaparin (LOVENOX) injection 40 mg  40 mg Subcutaneous Q24H Reyne Dumas, MD      . fluticasone (FLONASE) 50 MCG/ACT nasal spray 1 spray  1 spray Each Nare Daily PRN Reyne Dumas, MD      . HYDROmorphone (DILAUDID) injection 0.5 mg  0.5 mg Intravenous Q4H PRN Reyne Dumas, MD   0.5 mg at 04/18/14 0352  . ondansetron (ZOFRAN) tablet 4 mg  4 mg Oral Q6H PRN Reyne Dumas, MD       Or  . ondansetron (ZOFRAN) injection 4 mg  4 mg Intravenous Q6H PRN Reyne Dumas, MD   4 mg at 04/17/14 2127     Results for orders placed during the hospital encounter of 04/17/14 (from the past 48 hour(s))  URINALYSIS, ROUTINE W REFLEX MICROSCOPIC     Status: Abnormal   Collection Time    04/17/14  1:57 PM  Result Value Ref Range   Color, Urine YELLOW  YELLOW   APPearance CLEAR  CLEAR   Specific Gravity, Urine 1.014  1.005 - 1.030   pH 8.0  5.0 - 8.0   Glucose, UA NEGATIVE  NEGATIVE mg/dL   Hgb urine dipstick NEGATIVE  NEGATIVE   Bilirubin Urine NEGATIVE  NEGATIVE   Ketones, ur NEGATIVE  NEGATIVE mg/dL   Protein, ur NEGATIVE  NEGATIVE mg/dL   Urobilinogen, UA 0.2  0.0 - 1.0 mg/dL   Nitrite NEGATIVE  NEGATIVE   Leukocytes, UA TRACE (*) NEGATIVE  URINE MICROSCOPIC-ADD ON     Status: None   Collection Time    04/17/14  1:57 PM      Result Value Ref Range   Squamous Epithelial / LPF RARE  RARE   WBC, UA 3-6  <3 WBC/hpf   RBC / HPF 0-2  <3 RBC/hpf   Bacteria, UA RARE  RARE  CBC WITH DIFFERENTIAL     Status: Abnormal   Collection Time     04/17/14  2:57 PM      Result Value Ref Range   WBC 9.9  4.0 - 10.5 K/uL   RBC 4.47  3.87 - 5.11 MIL/uL   Hemoglobin 14.1  12.0 - 15.0 g/dL   HCT 40.3  36.0 - 46.0 %   MCV 90.2  78.0 - 100.0 fL   MCH 31.5  26.0 - 34.0 pg   MCHC 35.0  30.0 - 36.0 g/dL   RDW 12.4  11.5 - 15.5 %   Platelets 196  150 - 400 K/uL   Neutrophils Relative % 81 (*) 43 - 77 %   Neutro Abs 8.1 (*) 1.7 - 7.7 K/uL   Lymphocytes Relative 12  12 - 46 %   Lymphs Abs 1.2  0.7 - 4.0 K/uL   Monocytes Relative 6  3 - 12 %   Monocytes Absolute 0.5  0.1 - 1.0 K/uL   Eosinophils Relative 1  0 - 5 %   Eosinophils Absolute 0.1  0.0 - 0.7 K/uL   Basophils Relative 0  0 - 1 %   Basophils Absolute 0.0  0.0 - 0.1 K/uL  COMPREHENSIVE METABOLIC PANEL     Status: Abnormal   Collection Time    04/17/14  2:57 PM      Result Value Ref Range   Sodium 137  137 - 147 mEq/L   Potassium 4.1  3.7 - 5.3 mEq/L   Chloride 95 (*) 96 - 112 mEq/L   CO2 27  19 - 32 mEq/L   Glucose, Bld 141 (*) 70 - 99 mg/dL   BUN 14  6 - 23 mg/dL   Creatinine, Ser 1.26 (*) 0.50 - 1.10 mg/dL   Calcium 9.3  8.4 - 10.5 mg/dL   Total Protein 6.9  6.0 - 8.3 g/dL   Albumin 3.8  3.5 - 5.2 g/dL   AST 174 (*) 0 - 37 U/L   ALT 93 (*) 0 - 35 U/L   Alkaline Phosphatase 93  39 - 117 U/L   Total Bilirubin 0.8  0.3 - 1.2 mg/dL   GFR calc non Af Amer 46 (*) >90 mL/min   GFR calc Af Amer 54 (*) >90 mL/min   Comment: (NOTE)     The eGFR has been calculated using the CKD EPI equation.     This calculation has not been validated in all clinical situations.     eGFR's persistently <90 mL/min signify possible Chronic Kidney  Disease.   Anion gap 15  5 - 15  LIPASE, BLOOD     Status: None   Collection Time    04/17/14  2:57 PM      Result Value Ref Range   Lipase 43  11 - 59 U/L  COMPREHENSIVE METABOLIC PANEL     Status: Abnormal   Collection Time    04/18/14  4:03 AM      Result Value Ref Range   Sodium 141  137 - 147 mEq/L   Potassium 4.5  3.7 - 5.3 mEq/L    Chloride 105  96 - 112 mEq/L   Comment: DELTA CHECK NOTED     REPEATED TO VERIFY   CO2 24  19 - 32 mEq/L   Glucose, Bld 88  70 - 99 mg/dL   BUN 11  6 - 23 mg/dL   Creatinine, Ser 1.12 (*) 0.50 - 1.10 mg/dL   Calcium 9.0  8.4 - 10.5 mg/dL   Total Protein 6.0  6.0 - 8.3 g/dL   Albumin 3.2 (*) 3.5 - 5.2 g/dL   AST 347 (*) 0 - 37 U/L   ALT 339 (*) 0 - 35 U/L   Alkaline Phosphatase 104  39 - 117 U/L   Total Bilirubin 0.8  0.3 - 1.2 mg/dL   GFR calc non Af Amer 53 (*) >90 mL/min   GFR calc Af Amer 62 (*) >90 mL/min   Comment: (NOTE)     The eGFR has been calculated using the CKD EPI equation.     This calculation has not been validated in all clinical situations.     eGFR's persistently <90 mL/min signify possible Chronic Kidney     Disease.   Anion gap 12  5 - 15  CBC     Status: None   Collection Time    04/18/14  4:03 AM      Result Value Ref Range   WBC 5.5  4.0 - 10.5 K/uL   RBC 4.04  3.87 - 5.11 MIL/uL   Hemoglobin 12.8  12.0 - 15.0 g/dL   HCT 36.7  36.0 - 46.0 %   MCV 90.8  78.0 - 100.0 fL   MCH 31.7  26.0 - 34.0 pg   MCHC 34.9  30.0 - 36.0 g/dL   RDW 12.5  11.5 - 15.5 %   Platelets 170  150 - 400 K/uL  MAGNESIUM     Status: None   Collection Time    04/18/14  4:03 AM      Result Value Ref Range   Magnesium 2.0  1.5 - 2.5 mg/dL    US Abdomen Complete  04/17/2014   CLINICAL DATA:  Right upper quadrant pain  EXAM: ULTRASOUND ABDOMEN COMPLETE  COMPARISON:  Ultrasound of June 09, 2010.  FINDINGS: Gallbladder:  The gallbladder is adequately distended and contains multiple echogenic mobile shadowing stones. There is no gallbladder wall thickening or pericholecystic fluid. There is no positive sonographic Murphy's sign.  Common bile duct:  Diameter: The common bile duct is dilated to 10.7 mm and there is a 5 mm shadowing stone within the duct.  Liver:  No focal lesion identified. Within normal limits in parenchymal echogenicity.  IVC:  No abnormality visualized.  Pancreas:   Visualized portion unremarkable.  Spleen:  Size and appearance within normal limits.  Right Kidney:  Length: 9 cm. Echogenicity within normal limits. No mass or hydronephrosis visualized.  Left Kidney:  Length: 9.3 cm. Echogenicity within normal limits. No mass  or hydronephrosis visualized.  Abdominal aorta:  No aneurysm visualized.  Other findings:  None.  IMPRESSION: There are multiple mobile gallstones without sonographic evidence of acute cholecystitis. There is a 37m common bile duct stone and the common bile duct is dilated to 10.7 mm.  These results were called by telephone at the time of interpretation on 04/17/2014 at 2:59 PM to Dr. HHyman Bible, who verbally acknowledged these results.   Electronically Signed   By: David  JMartinique  On: 04/17/2014 15:00    Review of Systems  Constitutional: Positive for malaise/fatigue. Negative for fever and chills.  Respiratory: Positive for shortness of breath. Negative for cough and wheezing.   Cardiovascular: Negative.   Gastrointestinal: Positive for abdominal pain. Negative for nausea, vomiting, diarrhea and constipation.  Neurological: Positive for tingling and sensory change.  Psychiatric/Behavioral: Negative.    Blood pressure 106/61, pulse 59, temperature 97.9 F (36.6 C), temperature source Oral, resp. rate 16, height _0  (1.676 m), weight 170 lb (77.111 kg), SpO2 98.00%. Physical Exam General: Alert, well-developed Caucasian female, in no distress Skin: Warm and dry without rash or infection. HEENT: No palpable masses or thyromegaly. Sclera nonicteric. Pupils equal round and reactive. Lymph nodes: No cervical, supraclavicular, or inguinal nodes palpable. Lungs: Breath sounds clear and equal without increased work of breathing Cardiovascular: Regular rate and rhythm without murmur. No JVD or edema. Peripheral pulses intact. Abdomen: Nondistended. Mild epigastric and right upper quadrant tenderness but no guarding. No masses or  organomegaly. Extremities: No edema or joint swelling or deformity. No chronic venous stasis changes. Neurologic: Alert and fully oriented.   Assessment/Plan: 58year old female with cholelithiasis and worsening episodic colic. Ultrasound now shows gallstones and apparent common bile duct stone with mild dilatation of the common bile duct and moderately elevated LFTs which have increased since admission. She will require cholecystectomy. I think she has enough evidence for common bile duct stone that preoperative ERCP and stone extraction would be indicated. GI consult is pending. I will be available to proceed with cholecystectomy after ERCP is completed unless GI consult feels this is not indicated. I discussed laparoscopic cholecystectomy in detail.    We discussed the risks and benefits of a laparoscopic cholecystectomy and possible cholangiogram including, but not limited to bleeding, infection, injury to surrounding structures such as the intestine or liver, bile leak, retained gallstones, need to convert to an open procedure, prolonged diarrhea, blood clots such as  DVT, common bile duct injury, anesthesia risks, and possible need for additional procedures.  The likelihood of improvement in symptoms and return to the patient's normal status is good. We discussed the typical post-operative recovery course.  Rubina Basinski T 04/18/2014, 11:45 AM

## 2014-04-19 ENCOUNTER — Encounter (HOSPITAL_COMMUNITY)
Admission: EM | Disposition: A | Discharge status: Other Acute Inpatient Hospital | Payer: Self-pay | Source: Home / Self Care | Attending: Internal Medicine

## 2014-04-19 ENCOUNTER — Encounter (HOSPITAL_COMMUNITY): Payer: Self-pay | Admitting: *Deleted

## 2014-04-19 ENCOUNTER — Inpatient Hospital Stay (HOSPITAL_COMMUNITY): Payer: BC Managed Care – PPO | Admitting: Anesthesiology

## 2014-04-19 ENCOUNTER — Encounter (HOSPITAL_COMMUNITY): Payer: BC Managed Care – PPO | Admitting: Anesthesiology

## 2014-04-19 ENCOUNTER — Inpatient Hospital Stay (HOSPITAL_COMMUNITY): Payer: BC Managed Care – PPO

## 2014-04-19 HISTORY — PX: ERCP: SHX5425

## 2014-04-19 LAB — LIPASE, BLOOD: Lipase: 1080 U/L — ABNORMAL HIGH (ref 11–59)

## 2014-04-19 LAB — CSF CULTURE W GRAM STAIN
Gram Stain: NONE SEEN
Organism ID, Bacteria: NO GROWTH

## 2014-04-19 LAB — CSF CULTURE: Gram Stain: NONE SEEN

## 2014-04-19 LAB — MYELIN BASIC PROTEIN, CSF: Myelin Basic Protein: <2 mcg/L (ref 0.0–4.0)

## 2014-04-19 SURGERY — ERCP, WITH INTERVENTION IF INDICATED
Anesthesia: General

## 2014-04-19 MED ORDER — HYDROMORPHONE HCL PF 1 MG/ML IJ SOLN
0.5000 mg | INTRAMUSCULAR | Status: DC | PRN
  Administered 2014-04-19 – 2014-04-20 (×2): 1 mg via INTRAVENOUS
  Filled 2014-04-19 (×2): qty 1

## 2014-04-19 MED ORDER — SODIUM CHLORIDE 0.9 % IV SOLN
INTRAVENOUS | Status: DC | PRN
  Administered 2014-04-19: 12:00:00

## 2014-04-19 MED ORDER — LACTATED RINGERS IV SOLN
INTRAVENOUS | Status: DC | PRN
  Administered 2014-04-19: 11:00:00 via INTRAVENOUS

## 2014-04-19 MED ORDER — FENTANYL CITRATE 0.05 MG/ML IJ SOLN
INTRAMUSCULAR | Status: DC | PRN
Start: 2014-04-19 — End: 2014-04-19
  Administered 2014-04-19: 100 ug via INTRAVENOUS

## 2014-04-19 MED ORDER — ONDANSETRON HCL 4 MG/2ML IJ SOLN
INTRAMUSCULAR | Status: DC | PRN
  Administered 2014-04-19: 4 mg via INTRAVENOUS

## 2014-04-19 MED ORDER — EPHEDRINE SULFATE 50 MG/ML IJ SOLN
INTRAMUSCULAR | Status: AC
  Filled 2014-04-19: qty 1

## 2014-04-19 MED ORDER — LACTATED RINGERS IV SOLN: INTRAVENOUS | Status: DC

## 2014-04-19 MED ORDER — FENTANYL CITRATE 0.05 MG/ML IJ SOLN
INTRAMUSCULAR | Status: AC
  Filled 2014-04-19: qty 2

## 2014-04-19 MED ORDER — FENTANYL CITRATE 0.05 MG/ML IJ SOLN: 25.0000 ug | INTRAMUSCULAR | Status: DC | PRN

## 2014-04-19 MED ORDER — SUCCINYLCHOLINE CHLORIDE 20 MG/ML IJ SOLN
INTRAMUSCULAR | Status: DC | PRN
  Administered 2014-04-19: 100 mg via INTRAVENOUS

## 2014-04-19 MED ORDER — PROPOFOL 10 MG/ML IV BOLUS
INTRAVENOUS | Status: DC | PRN
  Administered 2014-04-19: 50 mg via INTRAVENOUS
  Administered 2014-04-19: 150 mg via INTRAVENOUS

## 2014-04-19 MED ORDER — ONDANSETRON HCL 4 MG/2ML IJ SOLN
INTRAMUSCULAR | Status: AC
  Filled 2014-04-19: qty 2

## 2014-04-19 MED ORDER — CIPROFLOXACIN IN D5W 400 MG/200ML IV SOLN
INTRAVENOUS | Status: AC
  Filled 2014-04-19: qty 200

## 2014-04-19 MED ORDER — KETOROLAC TROMETHAMINE 15 MG/ML IJ SOLN
15.0000 mg | Freq: Four times a day (QID) | INTRAMUSCULAR | Status: DC | PRN
  Administered 2014-04-19 – 2014-04-20 (×3): 15 mg via INTRAVENOUS
  Filled 2014-04-19 (×3): qty 1

## 2014-04-19 MED ORDER — GLUCAGON HCL RDNA (DIAGNOSTIC) 1 MG IJ SOLR
INTRAMUSCULAR | Status: AC
  Filled 2014-04-19: qty 2

## 2014-04-19 MED ORDER — SODIUM CHLORIDE 0.9 % IJ SOLN
INTRAMUSCULAR | Status: AC
  Filled 2014-04-19: qty 10

## 2014-04-19 MED ORDER — LIDOCAINE HCL (CARDIAC) 20 MG/ML IV SOLN
INTRAVENOUS | Status: AC
  Filled 2014-04-19: qty 5

## 2014-04-19 MED ORDER — GLUCAGON HCL RDNA (DIAGNOSTIC) 1 MG IJ SOLR
INTRAMUSCULAR | Status: DC | PRN
  Administered 2014-04-19: .5 mg via INTRAVENOUS

## 2014-04-19 MED ORDER — SODIUM CHLORIDE 0.9 % IV SOLN: INTRAVENOUS | Status: DC

## 2014-04-19 MED ORDER — MIDAZOLAM HCL 2 MG/2ML IJ SOLN
INTRAMUSCULAR | Status: AC
  Filled 2014-04-19: qty 2

## 2014-04-19 MED ORDER — PROPOFOL 10 MG/ML IV BOLUS
INTRAVENOUS | Status: AC
  Filled 2014-04-19: qty 20

## 2014-04-19 MED ORDER — MIDAZOLAM HCL 5 MG/5ML IJ SOLN
INTRAMUSCULAR | Status: DC | PRN
  Administered 2014-04-19: 2 mg via INTRAVENOUS

## 2014-04-19 MED ORDER — CIPROFLOXACIN IN D5W 400 MG/200ML IV SOLN
400.0000 mg | Freq: Once | INTRAVENOUS | Status: AC
  Administered 2014-04-19: 400 mg via INTRAVENOUS

## 2014-04-19 MED ORDER — LIDOCAINE HCL (PF) 2 % IJ SOLN
INTRAMUSCULAR | Status: DC | PRN
  Administered 2014-04-19: 75 mg via INTRADERMAL

## 2014-04-19 MED ORDER — PROMETHAZINE HCL 25 MG/ML IJ SOLN
6.2500 mg | INTRAMUSCULAR | Status: DC | PRN
Start: 2014-04-19 — End: 2014-04-19

## 2014-04-19 NOTE — Anesthesia Preprocedure Evaluation (Signed)
Anesthesia Evaluation  Patient identified by MRN, date of birth, ID band Patient awake    Reviewed: Allergy & Precautions, H&P , NPO status , Patient's Chart, lab work & pertinent test results  Airway Mallampati: II TM Distance: >3 FB Neck ROM: Full    Dental  (+) Teeth Intact, Dental Advisory Given   Pulmonary shortness of breath, former smoker,  breath sounds clear to auscultation  Pulmonary exam normal       Cardiovascular hypertension, Pt. on medications negative cardio ROS  Rhythm:Regular Rate:Normal     Neuro/Psych negative neurological ROS  negative psych ROS   GI/Hepatic negative GI ROS, Neg liver ROS,   Endo/Other  Hypothyroidism   Renal/GU negative Renal ROS  negative genitourinary   Musculoskeletal negative musculoskeletal ROS (+)   Abdominal   Peds  Hematology negative hematology ROS (+)   Anesthesia Other Findings   Reproductive/Obstetrics                           Anesthesia Physical Anesthesia Plan  ASA: II and emergent  Anesthesia Plan: General   Post-op Pain Management:    Induction:   Airway Management Planned: Oral ETT  Additional Equipment:   Intra-op Plan:   Post-operative Plan: Extubation in OR  Informed Consent: I have reviewed the patients History and Physical, chart, labs and discussed the procedure including the risks, benefits and alternatives for the proposed anesthesia with the patient or authorized representative who has indicated his/her understanding and acceptance.   Dental advisory given  Plan Discussed with: CRNA  Anesthesia Plan Comments:         Anesthesia Quick Evaluation

## 2014-04-19 NOTE — Transfer of Care (Signed)
Immediate Anesthesia Transfer of Care Note  Patient: Krista Chapman  Procedure(s) Performed: Procedure(s): ENDOSCOPIC RETROGRADE CHOLANGIOPANCREATOGRAPHY (ERCP) (N/A)  Patient Location: PACU  Anesthesia Type:General  Level of Consciousness: awake, alert  and patient cooperative  Airway & Oxygen Therapy: Patient Spontanous Breathing and Patient connected to face mask oxygen  Post-op Assessment: Report given to PACU RN, Post -op Vital signs reviewed and stable and Patient moving all extremities X 4  Post vital signs: Reviewed and stable  Complications: No apparent anesthesia complications

## 2014-04-19 NOTE — Interval H&P Note (Signed)
History and Physical Interval Note:  04/19/2014 10:07 AM  Krista Chapman  has presented today for surgery, with the diagnosis of BILE DUCT STONES  The various methods of treatment have been discussed with the patient and family. After consideration of risks, benefits and other options for treatment, the patient has consented to  Procedure(s): ENDOSCOPIC RETROGRADE CHOLANGIOPANCREATOGRAPHY (ERCP) (N/A) as a surgical intervention .  The patient's history has been reviewed, patient examined, no change in status, stable for surgery.  I have reviewed the patient's chart and labs.  Questions were answered to the patient's satisfaction.     Yisroel Mullendore M  Assessment:  1.  Bile duct stone. 2.  Dilated bile duct. 3.  Elevated liver function tests.  Plan:  1.  Endoscopic retrograde cholangiopancreatography with anticipated biliary sphincterotomy and bile duct stone extraction. 2.  Risks (up to and including bleeding, infection, perforation, pancreatitis that can be complicated by infected necrosis and death), benefits (removal of stones, alleviating blockage, decreasing risk of cholangitis or choledocholithiasis-related pancreatitis), and alternatives (watchful waiting, percutaneous transhepatic cholangiography) of ERCP were explained to patient/family in detail and patient elects to proceed.

## 2014-04-19 NOTE — Progress Notes (Signed)
Patient ID: Krista Chapman, female   DOB: [birth year], 58 y.o.   MRN: [id number] [facility] PROGRESS NOTE  Krista Chapman WUJ:[id number] DOB: 1956-01-30 DOA: 04/17/2014 PCP: Neldon Labella, MD  Brief narrative: 58 year old female with past medical history of anxiety, recurrent epigastric and right upper quadrant abdominal pain who now presented to [facility] ED 04/17/2014 with more frequent episodes of pain. She described pain, intermittent, about 6/10 in intensity and radiating to her back. She had nausea but no reports of vomiting. No fevers or chills. No reports of blood in stool or urine.   Assessment/Plan:   Active Problems:  Abdominal pain / Choledocholithiasis  - appreciate GI consult and recommendations; plan for ERCP this am and then surgery likely the following day or 2 per surgery  - keep NPO - may continue IV fluids - no complaints of pain this am Acute renal failure  - likely prerenal  - improving with IV fluids   DVT prophylaxis: SCD's bilaterally   Code Status: full code  Family Communication: plan of care discussed with the patient and family at the bedside  Disposition Plan: home when stable   Consultants:  Surgery  GI (Dr. Dulce Sellar) Procedures:  None  Antibiotics:  None    Manson Passey, MD  [facility] Pager [phone]  If 7PM-7AM, please contact night-coverage [email or web address] Password [facility] 04/19/2014, 6:56 AM   LOS: 2 days    HPI/Subjective: No acute overnight events.  Objective: Filed Vitals:   04/18/14 1430 04/18/14 2120 04/18/14 2145 04/19/14 0516  BP: 112/64  115/70 114/65  Pulse: 62 68 64 56  Temp: 97.9 F (36.6 C)  98.4 F (36.9 C) 97.9 F (36.6 C)  TempSrc: Oral  Oral Oral  Resp: 16  16 16   Height:      Weight:      SpO2: 99%  100% 98%    Intake/Output Summary (Last 24 hours) at 04/19/14 0656 Last data filed at 04/19/14 8657  Gross per 24 hour  Intake   2980 ml  Output   3300 ml  Net   -320 ml    Exam:   General:  Pt is  alert, follows commands appropriately, not in acute distress  Cardiovascular: Regular rate and rhythm, S1/S2, no murmurs  Respiratory: Clear to auscultation bilaterally, no wheezing, no crackles, no rhonchi  Abdomen: Soft, non tender, non distended, bowel sounds present  Extremities: No edema, pulses DP and PT palpable bilaterally  Neuro: Grossly nonfocal  Data Reviewed: Basic Metabolic Panel:  Recent Labs Lab 04/17/14 1457 04/18/14 0403  NA 137 141  K 4.1 4.5  CL 95* 105  CO2 27 24  GLUCOSE 141* 88  BUN 14 11  CREATININE 1.26* 1.12*  CALCIUM 9.3 9.0  MG  --  2.0   Liver Function Tests:  Recent Labs Lab 04/17/14 1457 04/18/14 0403  AST 174* 347*  ALT 93* 339*  ALKPHOS 93 104  BILITOT 0.8 0.8  PROT 6.9 6.0  ALBUMIN 3.8 3.2*    Recent Labs Lab 04/17/14 1457  LIPASE 43   No results found for this basename: AMMONIA,  in the last 168 hours CBC:  Recent Labs Lab 04/17/14 1457 04/18/14 0403  WBC 9.9 5.5  NEUTROABS 8.1*  --   HGB 14.1 12.8  HCT 40.3 36.7  MCV 90.2 90.8  PLT 196 170   Cardiac Enzymes: No results found for this basename: CKTOTAL, CKMB, CKMBINDEX, TROPONINI,  in the last 168 hours BNP: No components found with this basename: POCBNP,  CBG: No results found for this basename: GLUCAP,  in the last 168 hours  Recent Results (from the past 240 hour(s))  CSF CULTURE     Status: None   Collection Time    04/16/14  9:35 AM      Result Value Ref Range Status   Gram Stain No WBC Seen   Preliminary   Gram Stain No Organisms Seen   Preliminary   Preliminary Report NO GROWTH 2 DAYS   Preliminary     Studies: [place] Abdomen Complete 04/17/2014    IMPRESSION: There are multiple mobile gallstones without sonographic evidence of acute cholecystitis. There is a 5mm common bile duct stone and the common bile duct is dilated to 10.7 mm.  These results were called by telephone at the time of interpretation on 04/17/2014 at 2:59 PM to Dr. Santiago GladHEATHER LAISURE ,  who verbally acknowledged these results.   Electronically Signed   By: David  [place]   On: 04/17/2014 15:00    Scheduled Meds: . enoxaparin (LOVENOX) injection  40 mg Subcutaneous Q24H  . ketorolac  15 mg Intravenous Once   Continuous Infusions: . sodium chloride 1,000 mL (04/19/14 0508)

## 2014-04-19 NOTE — H&P (View-Only) (Signed)
Patient ID: Krista Chapman, female   DOB: [birth year], 58 y.o.   MRN: [id number] [facility] PROGRESS NOTE  KELISSA MERLIN WUJ:[id number] DOB: 1956-01-30 DOA: 04/17/2014 PCP: Neldon Labella, MD  Brief narrative: 58 year old female with past medical history of anxiety, recurrent epigastric and right upper quadrant abdominal pain who now presented to [facility] ED 04/17/2014 with more frequent episodes of pain. She described pain, intermittent, about 6/10 in intensity and radiating to her back. She had nausea but no reports of vomiting. No fevers or chills. No reports of blood in stool or urine.   Assessment/Plan:   Active Problems:  Abdominal pain / Choledocholithiasis  - appreciate GI consult and recommendations; plan for ERCP this am and then surgery likely the following day or 2 per surgery  - keep NPO - may continue IV fluids - no complaints of pain this am Acute renal failure  - likely prerenal  - improving with IV fluids   DVT prophylaxis: SCD's bilaterally   Code Status: full code  Family Communication: plan of care discussed with the patient and family at the bedside  Disposition Plan: home when stable   Consultants:  Surgery  GI (Dr. Dulce Sellar) Procedures:  None  Antibiotics:  None    Manson Passey, MD  [facility] Pager [phone]  If 7PM-7AM, please contact night-coverage [email or web address] Password [facility] 04/19/2014, 6:56 AM   LOS: 2 days    HPI/Subjective: No acute overnight events.  Objective: Filed Vitals:   04/18/14 1430 04/18/14 2120 04/18/14 2145 04/19/14 0516  BP: 112/64  115/70 114/65  Pulse: 62 68 64 56  Temp: 97.9 F (36.6 C)  98.4 F (36.9 C) 97.9 F (36.6 C)  TempSrc: Oral  Oral Oral  Resp: 16  16 16   Height:      Weight:      SpO2: 99%  100% 98%    Intake/Output Summary (Last 24 hours) at 04/19/14 0656 Last data filed at 04/19/14 8657  Gross per 24 hour  Intake   2980 ml  Output   3300 ml  Net   -320 ml    Exam:   General:  Pt is  alert, follows commands appropriately, not in acute distress  Cardiovascular: Regular rate and rhythm, S1/S2, no murmurs  Respiratory: Clear to auscultation bilaterally, no wheezing, no crackles, no rhonchi  Abdomen: Soft, non tender, non distended, bowel sounds present  Extremities: No edema, pulses DP and PT palpable bilaterally  Neuro: Grossly nonfocal  Data Reviewed: Basic Metabolic Panel:  Recent Labs Lab 04/17/14 1457 04/18/14 0403  NA 137 141  K 4.1 4.5  CL 95* 105  CO2 27 24  GLUCOSE 141* 88  BUN 14 11  CREATININE 1.26* 1.12*  CALCIUM 9.3 9.0  MG  --  2.0   Liver Function Tests:  Recent Labs Lab 04/17/14 1457 04/18/14 0403  AST 174* 347*  ALT 93* 339*  ALKPHOS 93 104  BILITOT 0.8 0.8  PROT 6.9 6.0  ALBUMIN 3.8 3.2*    Recent Labs Lab 04/17/14 1457  LIPASE 43   No results found for this basename: AMMONIA,  in the last 168 hours CBC:  Recent Labs Lab 04/17/14 1457 04/18/14 0403  WBC 9.9 5.5  NEUTROABS 8.1*  --   HGB 14.1 12.8  HCT 40.3 36.7  MCV 90.2 90.8  PLT 196 170   Cardiac Enzymes: No results found for this basename: CKTOTAL, CKMB, CKMBINDEX, TROPONINI,  in the last 168 hours BNP: No components found with this basename: POCBNP,  CBG: No results found for this basename: GLUCAP,  in the last 168 hours  Recent Results (from the past 240 hour(s))  CSF CULTURE     Status: None   Collection Time    04/16/14  9:35 AM      Result Value Ref Range Status   Gram Stain No WBC Seen   Preliminary   Gram Stain No Organisms Seen   Preliminary   Preliminary Report NO GROWTH 2 DAYS   Preliminary     Studies: [place] Abdomen Complete 04/17/2014    IMPRESSION: There are multiple mobile gallstones without sonographic evidence of acute cholecystitis. There is a 5mm common bile duct stone and the common bile duct is dilated to 10.7 mm.  These results were called by telephone at the time of interpretation on 04/17/2014 at 2:59 PM to Dr. Santiago GladHEATHER LAISURE ,  who verbally acknowledged these results.   Electronically Signed   By: David  [place]   On: 04/17/2014 15:00    Scheduled Meds: . enoxaparin (LOVENOX) injection  40 mg Subcutaneous Q24H  . ketorolac  15 mg Intravenous Once   Continuous Infusions: . sodium chloride 1,000 mL (04/19/14 0508)

## 2014-04-19 NOTE — Progress Notes (Signed)
Patient ID: Krista Chapman, female   DOB: 01/15/1956, 58 y.o.   MRN: [id number]    Subjective: Mild occasional pain.  No other C/O  Objective: Vital signs in last 24 hours: Temp:  [97.9 F (36.6 C)-98.4 F (36.9 C)] 97.9 F (36.6 C) (07/12 0516) Pulse Rate:  [56-68] 56 (07/12 0516) Resp:  [16] 16 (07/12 0516) BP: (112-115)/(64-70) 114/65 mmHg (07/12 0516) SpO2:  [98 %-100 %] 98 % (07/12 0516) Last BM Date: 04/17/14  Intake/Output from previous day: 07/11 0701 - 07/12 0700 In: 2980 [P.O.:1080; I.V.:1900] Out: 3300 [Urine:3300] Intake/Output this shift:    General appearance: alert, cooperative and no distress GI: abnormal findings:  mild tenderness in the epigastrium  Lab Results:   Recent Labs  04/17/14 1457 04/18/14 0403  WBC 9.9 5.5  HGB 14.1 12.8  HCT 40.3 36.7  PLT 196 170   BMET  Recent Labs  04/17/14 1457 04/18/14 0403  NA 137 141  K 4.1 4.5  CL 95* 10[place]5  Asa LenteUCOSE 141* 88  BUN 14 11  CREATININE [id number] [id number] by telephone at the time of interpretation on 04/17/2014 at 2:59 PM to Dr. HEATHER LAISURE , who verbally acknowledged these results.   Electronically Signed   By: David  Jordan   On: 04/17/2014 15:00    Anti-infectives: Anti-infectives   None      Assessment/Plan:  ENDOSCOPIC RETROGRADE CHOLANGIOPANCREATOGRAPHY (ERCP) - this AM Plan lap chole tomorrow AM    LOS: 2 days    Katheline Brendlinger T 04/19/2014

## 2014-04-19 NOTE — Anesthesia Postprocedure Evaluation (Signed)
Anesthesia Post Note  Patient: Krista Chapman  Procedure(s) Performed: Procedure(s) (LRB): ENDOSCOPIC RETROGRADE CHOLANGIOPANCREATOGRAPHY (ERCP) (N/A)  Anesthesia type: General  Patient location: PACU  Post pain: Pain level controlled  Post assessment: Post-op Vital signs reviewed  Last Vitals:  Filed Vitals:   04/19/14 1200  BP: 121/68  Pulse: 58  Temp:   Resp: 10    Post vital signs: Reviewed  Level of consciousness: sedated  Complications: No apparent anesthesia complications

## 2014-04-19 NOTE — Op Note (Signed)
[facility] [address] [place], [address]   ERCP PROCEDURE REPORT  PATIENT: Krista Chapman, Krista Chapman.  MR# :[id number] BIRTHDATE: [birth year]  GENDER: Female ENDOSCOPIST: Willis Modena, MD REFERRED BY: Glenna Fellows, M.D. PROCEDURE DATE:  04/19/2014 PROCEDURE:   ERCP with sphincterotomy/papillotomy and ERCP with removal of calculus/calculi ASA CLASS:    ASA-II INDICATIONS:  Dilated bile duct, elevated LFTs, common bile duct stone (seen on abdominal ultrasound) MEDICATIONS:    General endotracheal anesthesia (GETA), ciprofloxacin 400 mg IV, glucagon 0.5 mg IV  DESCRIPTION OF PROCEDURE:   After the risks benefits and alternatives of the procedure were thoroughly explained, informed consent was obtained.  The therapeutic side-viewing  duodenendoscope was introduced through the mouth and advanced to the second portion of the duodenum .    FINDINGS:  Patulous ampulla with orifice a bit "moth-eaten" in appearance, suggestive of possible prior stone passage.  Deep biliary access achieved.  Extrahepatic bile duct was prominent, about 75mm in diameter, and intrahepatic biliary tree was normal. Cystic duct filled and multiple gallstones were seen incidentally in the gallbladder.  Moderate biliary sphincterotomy then performed with blended current.  A 34mm dark green oblong stone was removed with biliary extraction balloon.  Multiple subsequent trawls revealed no further stones.  Post-completion occlusion cholangiogram was normal without evidence of residual filling defects.  Pancreatogram was not obtained, intentionally.  There were no immediate complications.  ENDOSCOPIC IMPRESSION:  As above.  Cholelithiasis. Choledocholithiasis, post ERCP with biliary sphincterotomy and stone extraction.  RECOMMENDATIONS: 1.  Watch for potential complications of procedure. 2.  No ASA, NSAIDs, blood thinners for 5 days post-papillotomy. 3.  Clear liquid diet today. 4.   Cholecystectomy planned with Dr. Johna Sheriff tomorrow, if no procedure complications from today. 5.  [facility] will follow.     _______________________________ Rosalie DoctorWillis Modena, MD 04/19/2014 12:43 PM   CC:

## 2014-04-20 ENCOUNTER — Encounter (HOSPITAL_COMMUNITY): Payer: Self-pay | Admitting: Anesthesiology

## 2014-04-20 ENCOUNTER — Encounter (HOSPITAL_COMMUNITY)
Admission: EM | Disposition: A | Discharge status: Other Acute Inpatient Hospital | Payer: Self-pay | Source: Home / Self Care | Attending: Internal Medicine

## 2014-04-20 ENCOUNTER — Encounter (HOSPITAL_COMMUNITY): Payer: Self-pay | Admitting: Gastroenterology

## 2014-04-20 ENCOUNTER — Inpatient Hospital Stay (HOSPITAL_COMMUNITY): Payer: BC Managed Care – PPO

## 2014-04-20 DIAGNOSIS — K859 Acute pancreatitis without necrosis or infection, unspecified: Secondary | ICD-10-CM

## 2014-04-20 LAB — COMPREHENSIVE METABOLIC PANEL
ALBUMIN: 2.9 g/dL — AB (ref 3.5–5.2)
ALK PHOS: 80 U/L (ref 39–117)
ALT: 147 U/L — AB (ref 0–35)
ALT: 109 U/L — ABNORMAL HIGH (ref 0–35)
AST: 51 U/L — AB (ref 0–37)
AST: 36 U/L (ref 0–37)
Albumin: 3 g/dL — ABNORMAL LOW (ref 3.5–5.2)
Alkaline Phosphatase: 76 U/L (ref 39–117)
Anion gap: 11 (ref 5–15)
Anion gap: 12 (ref 5–15)
BILIRUBIN TOTAL: 1.1 mg/dL (ref 0.3–1.2)
BUN: 6 mg/dL (ref 6–23)
BUN: 7 mg/dL (ref 6–23)
CALCIUM: 8 mg/dL — AB (ref 8.4–10.5)
CO2: 27 mEq/L (ref 19–32)
CO2: 25 mEq/L (ref 19–32)
CREATININE: 1.03 mg/dL (ref 0.50–1.10)
Calcium: 8.3 mg/dL — ABNORMAL LOW (ref 8.4–10.5)
Chloride: 102 mEq/L (ref 96–112)
Chloride: 102 mEq/L (ref 96–112)
Creatinine, Ser: 1.13 mg/dL — ABNORMAL HIGH (ref 0.50–1.10)
GFR calc Af Amer: 61 mL/min — ABNORMAL LOW (ref >90)
GFR calc Af Amer: 69 mL/min — ABNORMAL LOW (ref >90)
GFR calc non Af Amer: 53 mL/min — ABNORMAL LOW (ref >90)
GFR calc non Af Amer: 59 mL/min — ABNORMAL LOW (ref >90)
GLUCOSE: 106 mg/dL — AB (ref 70–99)
Glucose, Bld: 98 mg/dL (ref 70–99)
POTASSIUM: 4 meq/L (ref 3.7–5.3)
Potassium: 3.9 mEq/L (ref 3.7–5.3)
SODIUM: 140 meq/L (ref 137–147)
Sodium: 139 mEq/L (ref 137–147)
Total Bilirubin: 0.7 mg/dL (ref 0.3–1.2)
Total Protein: 5.7 g/dL — ABNORMAL LOW (ref 6.0–8.3)
Total Protein: 5.9 g/dL — ABNORMAL LOW (ref 6.0–8.3)

## 2014-04-20 LAB — CBC
HCT: 39.3 % (ref 36.0–46.0)
HEMATOCRIT: 42.3 % (ref 36.0–46.0)
HEMOGLOBIN: 13.1 g/dL (ref 12.0–15.0)
Hemoglobin: 14.5 g/dL (ref 12.0–15.0)
MCH: 30.8 pg (ref 26.0–34.0)
MCH: 31.5 pg (ref 26.0–34.0)
MCHC: 33.3 g/dL (ref 30.0–36.0)
MCHC: 34.3 g/dL (ref 30.0–36.0)
MCV: 92.3 fL (ref 78.0–100.0)
MCV: 92 fL (ref 78.0–100.0)
PLATELETS: 161 10*3/uL (ref 150–400)
PLATELETS: 155 10*3/uL (ref 150–400)
RBC: 4.26 MIL/uL (ref 3.87–5.11)
RBC: 4.6 MIL/uL (ref 3.87–5.11)
RDW: 12.4 % (ref 11.5–15.5)
RDW: 12.5 % (ref 11.5–15.5)
WBC: 10.1 10*3/uL (ref 4.0–10.5)
WBC: 9.8 10*3/uL (ref 4.0–10.5)

## 2014-04-20 LAB — SURGICAL PCR SCREEN
MRSA, PCR: NEGATIVE
STAPHYLOCOCCUS AUREUS: NEGATIVE

## 2014-04-20 LAB — LIPASE, BLOOD
LIPASE: 206 U/L — AB (ref 11–59)
LIPASE: 66 U/L — AB (ref 11–59)

## 2014-04-20 SURGERY — LAPAROSCOPIC CHOLECYSTECTOMY WITH INTRAOPERATIVE CHOLANGIOGRAM
Anesthesia: General

## 2014-04-20 MED ORDER — POTASSIUM CHLORIDE 2 MEQ/ML IV SOLN
INTRAVENOUS | Status: AC
  Administered 2014-04-20 – 2014-04-22 (×4): via INTRAVENOUS
  Filled 2014-04-20 (×15): qty 1000

## 2014-04-20 MED ORDER — IOHEXOL 300 MG/ML  SOLN: 100.0000 mL | Freq: Once | INTRAMUSCULAR | Status: AC | PRN

## 2014-04-20 MED ORDER — NALOXONE HCL 0.4 MG/ML IJ SOLN: 0.4000 mg | INTRAMUSCULAR | Status: DC | PRN

## 2014-04-20 MED ORDER — FENTANYL 10 MCG/ML IV SOLN
INTRAVENOUS | Status: DC
  Administered 2014-04-21: 09:00:00 via INTRAVENOUS
  Filled 2014-04-20: qty 50

## 2014-04-20 MED ORDER — HYDROMORPHONE HCL PF 2 MG/ML IJ SOLN
2.0000 mg | Freq: Once | INTRAMUSCULAR | Status: AC
  Administered 2014-04-20: 2 mg via INTRAVENOUS
  Filled 2014-04-20: qty 1

## 2014-04-20 MED ORDER — DIPHENHYDRAMINE HCL 50 MG/ML IJ SOLN: 12.5000 mg | Freq: Four times a day (QID) | INTRAMUSCULAR | Status: DC | PRN

## 2014-04-20 MED ORDER — FENTANYL 10 MCG/ML IV SOLN
INTRAVENOUS | Status: DC
  Administered 2014-04-20: 18:00:00 via INTRAVENOUS
  Administered 2014-04-21: 22.2 ug/h via INTRAVENOUS
  Administered 2014-04-21: 210 ug via INTRAVENOUS
  Filled 2014-04-20 (×2): qty 50

## 2014-04-20 MED ORDER — SODIUM CHLORIDE 0.9 % IJ SOLN: 9.0000 mL | INTRAMUSCULAR | Status: DC | PRN

## 2014-04-20 MED ORDER — CHLORHEXIDINE GLUCONATE 4 % EX LIQD
1.0000 "application " | Freq: Once | CUTANEOUS | Status: AC
  Administered 2014-04-20: 1 via TOPICAL
  Filled 2014-04-20: qty 15

## 2014-04-20 MED ORDER — IOHEXOL 300 MG/ML  SOLN: 50.0000 mL | Freq: Once | INTRAMUSCULAR | Status: AC | PRN

## 2014-04-20 MED ORDER — DIPHENHYDRAMINE HCL 12.5 MG/5ML PO ELIX: 12.5000 mg | ORAL_SOLUTION | Freq: Four times a day (QID) | ORAL | Status: DC | PRN

## 2014-04-20 MED ORDER — ONDANSETRON HCL 4 MG/2ML IJ SOLN: 4.0000 mg | Freq: Four times a day (QID) | INTRAMUSCULAR | Status: DC | PRN

## 2014-04-20 MED ORDER — FENTANYL CITRATE 0.05 MG/ML IJ SOLN
25.0000 ug | INTRAMUSCULAR | Status: DC | PRN
  Administered 2014-04-20 (×3): 50 ug via INTRAVENOUS
  Administered 2014-04-20: 25 ug via INTRAVENOUS
  Filled 2014-04-20 (×4): qty 2

## 2014-04-20 MED ORDER — CHLORHEXIDINE GLUCONATE 4 % EX LIQD
1.0000 "application " | Freq: Once | CUTANEOUS | Status: DC
  Filled 2014-04-20: qty 15

## 2014-04-20 NOTE — Progress Notes (Signed)
Communicated with patient and family throughout the day. In the morning pain regimen changed to Fentanyl IV pushes q 2 hours to see if this would control her pain better. Continuously assess patient. Patient stated pain increased with activity. Rated pain a 3/10 on bedrest. When patient stated that IV pushes were not managing pain I discussed pain regimen with Elisabeth Pigeon MD. Placed patient on a PCA. CT scan, labs and foley ordered as well. I spoke with patient and family throughout the day. Charge nurse Bernette Redbird), director of [facility], and [facility] also addressed family's concerns throughout the day.   Elisabeth Pigeon MD's recommendation overnight is to switch PCA to dilaudid if pain not being managed with current settings.

## 2014-04-20 NOTE — Progress Notes (Signed)
Spoke to [employer] about increased pain and order for increasing dilaudid obtained.

## 2014-04-20 NOTE — Significant Event (Addendum)
  Family is very frustrated because the patient has persistent pain because she developed pancreatitis after ERCP. Apparently they were told there was a minimal chance to develop pancreatitis yet she developed it. Patient is reluctant to accept that surgery may be successful. She has convinced herself that prognosis would not be favorable following surgery based on having developed pancreatitis after ERCP. Please note that her family told me "well how do you know she is not going to die from this, our grandmother has died from similar event, our cousin...)". They also went on to say that it seems that no one is really knowing what is going on with the patient. I reassured tham that we all work as a Administrator, [employer] starting from nursing staph, technicians, [facility] and consultants).   Dr. Dulce Sellar said that the pain is expected especially with pancreatitis but we can do CT if pain persisted. The patient kept saying no one has updated her meanwhile I took personally at least 55 minutes to update and talk to the pt and each and every member of the family and extended family (including friend per pt request). GI also spent significant amount of time as well.   While the family was appreciative of my updates and the  time i spent with them I feel there is a lack of trust on their part (asking me for example where i am from, where did i obtain my degree...) which is detracting from doctor-patient relationship I tried to establish so I think it would be best to have another [facility] take over starting 7/14.  I asked floor manager to assign Mrs. Beckius to another team starting 7/14. I appreciate their help.  Manson Passey [facility] [phone]

## 2014-04-20 NOTE — Progress Notes (Signed)
Spoke to Dr Dulce Sellar about elevated lipase level. Orders received.

## 2014-04-20 NOTE — Progress Notes (Signed)
Subjective: Abdominal pain (RUQ and epigastric) a few hours after the procedure.  Objective: Vital signs in last 24 hours: Temp:  [97.7 F (36.5 C)-98.7 F (37.1 C)] 98.7 F (37.1 C) (07/13 0645) Pulse Rate:  [47-69] 69 (07/13 0645) Resp:  [10-18] 16 (07/13 0645) BP: (103-121)/(63-95) 111/67 mmHg (07/13 0645) SpO2:  [95 %-100 %] 95 % (07/13 0645) Weight change:  Last BM Date: 04/17/14  PE: GEN:  NAD ABD:  Heating pad RUQ, generalized tenderness right upper quadrant and epigastrium, hypoactive bowel sounds, no peritonitis  Lab Results: CBC    Component Value Date/Time   WBC 10.1 04/20/2014 0407   RBC 4.26 04/20/2014 0407   HGB 13.1 04/20/2014 0407   HCT 39.3 04/20/2014 0407   PLT 161 04/20/2014 0407   MCV 92.3 04/20/2014 0407   MCH 30.8 04/20/2014 0407   MCHC 33.3 04/20/2014 0407   RDW 12.4 04/20/2014 0407   LYMPHSABS 1.2 04/17/2014 1457   MONOABS 0.5 04/17/2014 1457   EOSABS 0.1 04/17/2014 1457   BASOSABS 0.0 04/17/2014 1457   Lipase     Component Value Date/Time   LIPASE 206* 04/20/2014 0407   CMP     Component Value Date/Time   NA 140 04/20/2014 0407   K 4.0 04/20/2014 0407   CL 102 04/20/2014 0407   CO2 27 04/20/2014 0407   GLUCOSE 106* 04/20/2014 0407   BUN 6 04/20/2014 0407   CREATININE 1.13* 04/20/2014 0407   CALCIUM 8.3* 04/20/2014 0407   PROT 5.7* 04/20/2014 0407   ALBUMIN 3.0* 04/20/2014 0407   AST 51* 04/20/2014 0407   ALT 147* 04/20/2014 0407   ALKPHOS 80 04/20/2014 0407   BILITOT 0.7 04/20/2014 0407   GFRNONAA 53* 04/20/2014 0407   GFRAA 61* 04/20/2014 0407   Assessment:  1.  Cholelithiasis.  Cholecystectomy on hold as per below. 2.  Choledocholithiasis with ERCP, biliary sphincterotomy, bile duct stone extraction. 3.  Abdominal pain with elevated lipase, no fevers, no leukocytosis.  Highly compatible with post-ERCP pancreatitis.  Plan:  1.  NPO except sips of water. 2.  Intravenous fluids, analgesics, bowel rest. 3.  Daily lipase and LFTs. 4.  Hopefully will  improve enough to have cholecystectomy in the next day or two. 5.  Will follow.   Krista Chapman 04/20/2014, 9:39 AM

## 2014-04-20 NOTE — Progress Notes (Signed)
Patient ID: Krista Chapman, female   DOB: 04/25/1956, 58 y.o.   MRN: [id number] [facility] PROGRESS NOTE  Krista Chapman YQM:[id number] DOB: 11/21/1955 DOA: 04/17/2014 PCP: Neldon LabellaMILLER,LISA LYNN, MD  Brief narrative: 58 year old female with past medical history of anxiety, recurrent epigastric and right upper quadrant abdominal pain who now [facility]/2014 with more frequent episodes of pain. She described pain, intermittent, about 6/10 in intensity and radiating to her back. She had nausea but no reports of vomiting. No fevers or chills. No reports of blood in stool or urine.  This is a very anxious patient and her extended family had many questions which i have tried to answer to the best of my ability. I spent total of 55 minutes (including the initial morning visit and later update to other family members). They all keep comparing Mrs Krista Chapman's situation to their grandmother and cousin, they think that she is medically unstable and "may die any moment", they request MRI to be done and antibiotics. I think considering her vital at this time she is medically stable to stay on this unit. She does not require further studies since we already know she has pancreatitis based on elevated lipase levels. I explained that to every family member in the room. They seem to be very upset and say well people keep saying she has refused surgery and now she is being sort of "targeted" for that. I again explained to them that's not the case and surgery actually cannot be done in case of acute pancreatitis. I said I will call Dr. Dulce Sellarutlaw to answer further questions related to ERCP as this is as much I can do.   Assessment/Plan:   Active Problems:  Abdominal pain / Choledocholithiasis / Acute pancreatitis - status post ERCP - as developed acute pancreatitis with lipase level in 1000 range but now 206 - she still has pain so we changed dilaudid to phenergan; she requested scheduled every 2 hours but i have told RN  to keep it ORN and to re-assess if ok to give pain med  - may continue IV fluids  Acute renal failure  - likely prerenal  - continue IV fluids - follow up BMP in am  DVT prophylaxis: SCD's bilaterally   Code Status: full code  Family Communication: plan of care discussed with the patient and family at the bedside  Disposition Plan: home when stable   Consultants:  Surgery  GI (Dr. Dulce Sellarutlaw) Procedures:  None  Antibiotics:  None    Manson PasseyEVINE, Krista Casebeer, MD  [facility] Pager [phone]  If 7PM-7AM, please contact night-coverage [email or web address] Password [email or web address] 04/20/2014, 1:58 PM   LOS: 3 days    HPI/Subjective: No acute overnight events.  Objective: Filed Vitals:   04/19/14 1230 04/19/14 1950 04/19/14 2052 04/20/14 0645  BP: 118/64  103/63 111/67  Pulse: 47 66 59 69  Temp: 97.7 F (36.5 C)  98.6 F (37 C) 98.7 F (37.1 C)  TempSrc:   Oral Oral  Resp: 12  16 16   Height:      Weight:      SpO2: 100%  100% 95%    Intake/Output Summary (Last 24 hours) at 04/20/14 1358 Last data filed at 04/20/14 0700  Gross per 24 hour  Intake 2012.5 ml  Output    800 ml  Net 1212.5 ml    Exam:   General:  Pt is alert, follows commands appropriately, not in acute distress  Cardiovascular: Regular rate and rhythm, S1/S2, no murmurs  Respiratory: Clear to auscultation bilaterally, no wheezing, no crackles, no rhonchi  Abdomen: Soft, non tender, non distended, bowel sounds present  Extremities: No edema, pulses DP and PT palpable bilaterally  Neuro: Grossly nonfocal  Data Reviewed: Basic Metabolic Panel:  Recent Labs Lab 04/17/14 1457 04/18/14 0403 04/20/14 0407  NA 137 141 140  K 4.1 4.5 4.0  CL 95* 105 102  CO2 27 24 27   GLUCOSE 141* 88 106*  BUN 14 11 6   CREATININE 1.26* 1.12* 1.13*  CALCIUM 9.3 9.0 8.3*  MG  --  2.0  --    Liver Function Tests:  Recent Labs Lab 04/17/14 1457 04/18/14 0403 04/20/14 0407  AST 174* 347* 51*  ALT 93* 339* 147*   ALKPHOS 93 104 80  BILITOT 0.8 0.8 0.7  PROT 6.9 6.0 5.7*  ALBUMIN 3.8 3.2* 3.0*    Recent Labs Lab 04/17/14 1457 04/19/14 1917 04/20/14 0407  LIPASE 43 1080* 206*   No results found for this basename: AMMONIA,  in the last 168 hours CBC:  Recent Labs Lab 04/17/14 1457 04/18/14 0403 04/20/14 0407  WBC 9.9 5.5 10.1  NEUTROABS 8.1*  --   --   HGB 14.1 12.8 13.1  HCT 40.3 36.7 39.3  MCV 90.2 90.8 92.3  PLT 196 170 161   Cardiac Enzymes: No results found for this basename: CKTOTAL, CKMB, CKMBINDEX, TROPONINI,  in the last 168 hours BNP: No components found with this basename: POCBNP,  CBG: No results found for this basename: GLUCAP,  in the last 168 hours  CSF CULTURE     Status: None   Collection Time    04/16/14  9:35 AM      Result Value Ref Range Status   Gram Stain No WBC Seen   Final   Gram Stain No Organisms Seen   Final   Organism ID, Bacteria NO GROWTH 3 DAYS   Final  SURGICAL PCR SCREEN     Status: None   Collection Time    04/20/14  2:47 AM      Result Value Ref Range Status   MRSA, PCR NEGATIVE  NEGATIVE Final   Staphylococcus aureus NEGATIVE  NEGATIVE Final     Studies: Dg Ercp With Sphincterotomy 04/19/2014    IMPRESSION: 1. ERCP with stone retraction, biliary sweeping and presumed sphincterotomy as above. 2. Cholelithiasis as demonstrated on preceding abdominal ultrasound. These images were submitted for radiologic interpretation only. Please see the procedural report for the amount of contrast and the fluoroscopy time utilized.      Scheduled Meds: . ketorolac  15 mg Intravenous Once   Continuous Infusions: . 0.9 % sodium chloride with kcl 125 mL/hr at 04/20/14 0845

## 2014-04-20 NOTE — Progress Notes (Signed)
Patient ID: Krista Chapman, female   DOB: 01-May-1956, 58 y.o.   MRN: [id number] 1 Day Post-Op  Subjective: Developed epigastric and right upper quadrant abdominal pain last night. Still having pain this morning particularly when moving about.  Objective: Vital signs in last 24 hours: Temp:  [97.7 F (36.5 C)-98.7 F (37.1 C)] 98.7 F (37.1 C) (07/13 0645) Pulse Rate:  [47-69] 69 (07/13 0645) Resp:  [10-18] 16 (07/13 0645) BP: (103-121)/(63-95) 111/67 mmHg (07/13 0645) SpO2:  [95 %-100 %] 95 % (07/13 0645) Last BM Date: 04/17/14  Intake/Output from previous day: 07/12 0701 - 07/13 0700 In: 600 [I.V.:600] Out: 500 [Urine:500] Intake/Output this shift:    General appearance: alert, cooperative and mild distress GI: abnormal findings:  moderate tenderness in the RUQ and without guarding  Lab Results:   Recent Labs  04/18/14 0403 04/20/14 0407  WBC 5.5 10.1  HGB 12.8 13.1  HCT 36.7 39.3  PLT 170 161   BMET  Recent Labs  04/18/14 0403 04/20/14 0407  NA 141 140  K 4.5 4.0  CL 105 102  CO2 24 27  GLUCOSE 88 106*  BUN 11 6  CREATININE 1.12* 1.13*  CALCIUM 9.0 8.3*   Lab Results  Component Value Date   ALT 147* 04/20/2014   AST 51* 04/20/2014   ALKPHOS 80 04/20/2014   BILITOT 0.7 04/20/2014   Lab Results  Component Value Date   LIPASE 206* 04/20/2014                                     1080 last night   Studies/Results: Dg Ercp With Sphincterotomy  04/19/2014   CLINICAL DATA:  ERCP with sphincterotomy for common bile duct stones.  EXAM: ERCP  TECHNIQUE: Multiple spot images obtained with the fluoroscopic device and submitted for interpretation post-procedure.  FLUOROSCOPY TIME:  2 min, 43 second  COMPARISON:  Abdominal ultrasound - 04/17/2014; CT abdomen pelvis- 06/09/2010  FINDINGS: Six spot intraoperative fluoroscopic images during ERCP are provided for review.  Initial image demonstrates an ERCP probe overlying the right upper abdominal quadrant. There is subsequent  selective cannulation opacification of the common bile duct.  There are 2 discrete filling defects within the distal aspect of the CBD. There is instillation of a biliary sphincterotomy balloon within in the central aspect of the common bile duct.  Completion image demonstrates apparent retraction of the CBD stones.  There is minimal opacification of the gallbladder with multiple filling defects compatible with the cholelithiasis demonstrated on preceding abdominal ultrasound.  There is minimal opacification of the central aspect of the intrahepatic biliary tree which appears nondilated. There is no definitive opacification of the pancreatic duct.  IMPRESSION: 1. ERCP with stone retraction, biliary sweeping and presumed sphincterotomy as above. 2. Cholelithiasis as demonstrated on preceding abdominal ultrasound. These images were submitted for radiologic interpretation only. Please see the procedural report for the amount of contrast and the fluoroscopy time utilized.   Electronically Signed   By: Simonne Come M.D.   On: 04/19/2014 12:19    Anti-infectives: Anti-infectives   Start     Dose/Rate Route Frequency Ordered Stop   04/19/14 1045  ciprofloxacin (CIPRO) IVPB 400 mg     400 mg 200 mL/hr over 60 Minutes Intravenous  Once 04/19/14 1035 04/19/14 1158      Assessment/Plan: s/p Procedure(s): ENDOSCOPIC RETROGRADE CHOLANGIOPANCREATOGRAPHY (ERCP) Apparent acute pancreatitis post ERCP.  Lipase trending down  this morning. Will hold off on cholecystectomy today until symptoms improve and lipase normalizes. Discussed with patient and her son and all her questions answered   LOS: 3 days    Leovardo Thoman T 04/20/2014

## 2014-04-21 DIAGNOSIS — N179 Acute kidney failure, unspecified: Secondary | ICD-10-CM | POA: Diagnosis present

## 2014-04-21 DIAGNOSIS — K668 Other specified disorders of peritoneum: Secondary | ICD-10-CM

## 2014-04-21 LAB — DIFFERENTIAL
Basophils Absolute: 0 10*3/uL (ref 0.0–0.1)
Basophils Relative: 0 % (ref 0–1)
Eosinophils Absolute: 0 10*3/uL (ref 0.0–0.7)
Eosinophils Relative: 0 % (ref 0–5)
LYMPHS ABS: 0.9 10*3/uL (ref 0.7–4.0)
Lymphocytes Relative: 8 % — ABNORMAL LOW (ref 12–46)
MONOS PCT: 5 % (ref 3–12)
Monocytes Absolute: 0.6 10*3/uL (ref 0.1–1.0)
NEUTROS ABS: 9.5 10*3/uL — AB (ref 1.7–7.7)
NEUTROS PCT: 87 % — AB (ref 43–77)

## 2014-04-21 LAB — COMPREHENSIVE METABOLIC PANEL
ALK PHOS: 71 U/L (ref 39–117)
ALT: 85 U/L — ABNORMAL HIGH (ref 0–35)
ANION GAP: 14 (ref 5–15)
AST: 31 U/L (ref 0–37)
Albumin: 2.5 g/dL — ABNORMAL LOW (ref 3.5–5.2)
BUN: 9 mg/dL (ref 6–23)
CO2: 22 meq/L (ref 19–32)
Calcium: 7.5 mg/dL — ABNORMAL LOW (ref 8.4–10.5)
Chloride: 101 mEq/L (ref 96–112)
Creatinine, Ser: 0.94 mg/dL (ref 0.50–1.10)
GFR calc non Af Amer: 66 mL/min — ABNORMAL LOW (ref >90)
GFR, EST AFRICAN AMERICAN: 77 mL/min — AB (ref >90)
GLUCOSE: 104 mg/dL — AB (ref 70–99)
POTASSIUM: 3.8 meq/L (ref 3.7–5.3)
Sodium: 137 mEq/L (ref 137–147)
Total Bilirubin: 1.4 mg/dL — ABNORMAL HIGH (ref 0.3–1.2)
Total Protein: 5.4 g/dL — ABNORMAL LOW (ref 6.0–8.3)

## 2014-04-21 LAB — LIPASE, BLOOD: Lipase: 50 U/L (ref 11–59)

## 2014-04-21 LAB — CBC
HCT: 41.8 % (ref 36.0–46.0)
Hemoglobin: 14 g/dL (ref 12.0–15.0)
MCH: 30.7 pg (ref 26.0–34.0)
MCHC: 33.5 g/dL (ref 30.0–36.0)
MCV: 91.7 fL (ref 78.0–100.0)
Platelets: 168 10*3/uL (ref 150–400)
RBC: 4.56 MIL/uL (ref 3.87–5.11)
RDW: 12.6 % (ref 11.5–15.5)
WBC: 11.2 10*3/uL — ABNORMAL HIGH (ref 4.0–10.5)

## 2014-04-21 MED ORDER — SODIUM CHLORIDE 0.9 % IJ SOLN: 9.0000 mL | INTRAMUSCULAR | Status: DC | PRN

## 2014-04-21 MED ORDER — DIPHENHYDRAMINE HCL 12.5 MG/5ML PO ELIX
12.5000 mg | ORAL_SOLUTION | Freq: Four times a day (QID) | ORAL | Status: DC | PRN
Start: 2014-04-21 — End: 2014-04-21

## 2014-04-21 MED ORDER — NALOXONE HCL 0.4 MG/ML IJ SOLN: 0.4000 mg | INTRAMUSCULAR | Status: DC | PRN

## 2014-04-21 MED ORDER — LIP MEDEX EX OINT
TOPICAL_OINTMENT | CUTANEOUS | Status: DC | PRN
  Filled 2014-04-21: qty 7

## 2014-04-21 MED ORDER — MORPHINE SULFATE (PF) 1 MG/ML IV SOLN: INTRAVENOUS | Status: DC

## 2014-04-21 MED ORDER — DIPHENHYDRAMINE HCL 50 MG/ML IJ SOLN: 12.5000 mg | Freq: Four times a day (QID) | INTRAMUSCULAR | Status: DC | PRN

## 2014-04-21 MED ORDER — ONDANSETRON HCL 4 MG/2ML IJ SOLN: 4.0000 mg | Freq: Four times a day (QID) | INTRAMUSCULAR | Status: DC | PRN

## 2014-04-21 MED ORDER — DIPHENHYDRAMINE HCL 12.5 MG/5ML PO ELIX: 12.5000 mg | ORAL_SOLUTION | Freq: Four times a day (QID) | ORAL | Status: DC | PRN

## 2014-04-21 MED ORDER — MORPHINE SULFATE (PF) 1 MG/ML IV SOLN
INTRAVENOUS | Status: DC
  Administered 2014-04-21: 3 mg via INTRAVENOUS
  Administered 2014-04-21: 14.43 mg via INTRAVENOUS
  Administered 2014-04-21: 23.43 mg via INTRAVENOUS
  Administered 2014-04-22: 3 mg via INTRAVENOUS
  Administered 2014-04-22: 6 mg via INTRAVENOUS
  Administered 2014-04-22: 19:00:00 via INTRAVENOUS
  Administered 2014-04-22: 1.5 mg via INTRAVENOUS
  Administered 2014-04-22: 15 mg via INTRAVENOUS
  Administered 2014-04-22: 07:00:00 via INTRAVENOUS
  Administered 2014-04-22: 21 mg via INTRAVENOUS
  Administered 2014-04-23: 6 mg via INTRAVENOUS
  Administered 2014-04-23: 19.62 mg via INTRAVENOUS
  Administered 2014-04-23: 6 mg via INTRAVENOUS
  Administered 2014-04-23: 12 mg via INTRAVENOUS
  Administered 2014-04-23: 02:00:00 via INTRAVENOUS
  Administered 2014-04-23: 6 mg via INTRAVENOUS
  Administered 2014-04-23: 13:00:00 via INTRAVENOUS
  Administered 2014-04-23: 12 mg via INTRAVENOUS
  Administered 2014-04-24: 22.38 mg via INTRAVENOUS
  Administered 2014-04-24: 6 mg via INTRAVENOUS
  Administered 2014-04-24 (×2): via INTRAVENOUS
  Administered 2014-04-24: 15 mg via INTRAVENOUS
  Administered 2014-04-25: 9 mg via INTRAVENOUS
  Administered 2014-04-25: 19.4 mg via INTRAVENOUS
  Administered 2014-04-25: 15:00:00 via INTRAVENOUS
  Administered 2014-04-25: 6 mg via INTRAVENOUS
  Administered 2014-04-25: 10.44 mg via INTRAVENOUS
  Administered 2014-04-26: 21:00:00 via INTRAVENOUS
  Administered 2014-04-26: 4.44 mg via INTRAVENOUS
  Administered 2014-04-26: 24 mg via INTRAVENOUS
  Administered 2014-04-26: 15 mg via INTRAVENOUS
  Administered 2014-04-26: 02:00:00 via INTRAVENOUS
  Administered 2014-04-26: 9 mg via INTRAVENOUS
  Administered 2014-04-26: 16:00:00 via INTRAVENOUS
  Administered 2014-04-26: 23.75 mg via INTRAVENOUS
  Administered 2014-04-26: 13.2 mg via INTRAVENOUS
  Administered 2014-04-27: 3 mg via INTRAVENOUS
  Administered 2014-04-27: 6 mg via INTRAVENOUS
  Administered 2014-04-27: 16.5 mg via INTRAVENOUS
  Administered 2014-04-27: 7.5 mg via INTRAVENOUS
  Administered 2014-04-27: 21:00:00 via INTRAVENOUS
  Administered 2014-04-27: 9 mg via INTRAVENOUS
  Filled 2014-04-21 (×15): qty 25

## 2014-04-21 MED ORDER — CEFAZOLIN SODIUM-DEXTROSE 2-3 GM-% IV SOLR
2.0000 g | INTRAVENOUS | Status: AC
  Filled 2014-04-21: qty 50

## 2014-04-21 MED ORDER — PIPERACILLIN-TAZOBACTAM 3.375 G IVPB 30 MIN
3.3750 g | Freq: Once | INTRAVENOUS | Status: AC
  Administered 2014-04-21: 3.375 g via INTRAVENOUS
  Filled 2014-04-21: qty 50

## 2014-04-21 MED ORDER — MORPHINE SULFATE (PF) 1 MG/ML IV SOLN
INTRAVENOUS | Status: DC
  Administered 2014-04-21: 12:00:00 via INTRAVENOUS
  Filled 2014-04-21: qty 25

## 2014-04-21 MED ORDER — PIPERACILLIN-TAZOBACTAM 3.375 G IVPB
3.3750 g | Freq: Three times a day (TID) | INTRAVENOUS | Status: DC
  Administered 2014-04-21 – 2014-04-27 (×19): 3.375 g via INTRAVENOUS
  Filled 2014-04-21 (×22): qty 50

## 2014-04-21 MED ORDER — ZINC OXIDE 11.3 % EX CREA
TOPICAL_CREAM | CUTANEOUS | Status: DC | PRN
  Filled 2014-04-21: qty 56

## 2014-04-21 MED ORDER — PIPERACILLIN-TAZOBACTAM 3.375 G IVPB
3.3750 g | Freq: Three times a day (TID) | INTRAVENOUS | Status: DC
  Filled 2014-04-21: qty 50

## 2014-04-21 NOTE — Progress Notes (Signed)
ANTIBIOTIC CONSULT NOTE - INITIAL  Pharmacy Consult for zosyn Indication: Cholelithiases-symptomatic/intra-abdomenal infection   Allergies  Allergen Reactions  . Codeine Nausea Only    Patient Measurements: Height: 5\' 6"  (167.6 cm) Weight: 170 lb (77.111 kg) IBW/kg (Calculated) : 59.3 Adjusted Body Weight:   Vital Signs: Temp: 99.8 F (37.7 C) (07/13 2247) Temp src: Oral (07/13 2247) BP: 105/56 mmHg (07/13 2247) Pulse Rate: 80 (07/13 2247) Intake/Output from previous day: 07/13 0701 - 07/14 0700 In: -  Out: 300 [Urine:300] Intake/Output from this shift:    Labs:  Recent Labs  04/18/14 0403 04/20/14 0407 04/20/14 1905  WBC 5.5 10.1 9.8  HGB 12.8 13.1 14.5  PLT 170 161 155  CREATININE 1.12* 1.13* 1.03   Estimated Creatinine Clearance: 63.2 ml/min (by C-G formula based on Cr of 1.03). No results found for this basename: VANCOTROUGH, Leodis BinetVANCOPEAK, VANCORANDOM, GENTTROUGH, GENTPEAK, GENTRANDOM, TOBRATROUGH, TOBRAPEAK, TOBRARND, AMIKACINPEAK, AMIKACINTROU, AMIKACIN,  in the last 72 hours   Microbiology: Recent Results (from the past 720 hour(s))  CSF CULTURE     Status: None   Collection Time    04/16/14  9:35 AM      Result Value Ref Range Status   Gram Stain No WBC Seen   Final   Gram Stain No Organisms Seen   Final   Organism ID, Bacteria NO GROWTH 3 DAYS   Final  SURGICAL PCR SCREEN     Status: None   Collection Time    04/20/14  2:47 AM      Result Value Ref Range Status   MRSA, PCR NEGATIVE  NEGATIVE Final   Staphylococcus aureus NEGATIVE  NEGATIVE Final   Comment:            The Xpert SA Assay (FDA     approved for NASAL specimens     in patients over 57 years of age),     is one component of     a comprehensive surveillance     program.  Test performance has     been validated by [facility] for patients greater     than or equal to 682 year old.     It is not intended     to diagnose infection nor to     guide or monitor treatment.     Medical History: Past Medical History  Diagnosis Date  . Gallstones   . Palpitation   . Constipation   . Hypothyroidism     Medications:  Anti-infectives   Start     Dose/Rate Route Frequency Ordered Stop   04/21/14 0230  piperacillin-tazobactam (ZOSYN) IVPB 3.375 g     3.375 g 12.5 mL/hr over 240 Minutes Intravenous 3 times per day 04/21/14 0216     04/19/14 1045  ciprofloxacin (CIPRO) IVPB 400 mg     400 mg 200 mL/hr over 60 Minutes Intravenous  Once 04/19/14 1035 04/19/14 1158     Assessment: Patient with Intra-abdominal Infection.  Goal of Therapy:  Zosyn based on renal function   Plan:  Follow up culture results Zosyn 3.375g IV Q8H infused over 4hrs.   Darlina GuysGrimsley Jr, Jacquenette ShoneJulian Crowford 04/21/2014,2:31 AM

## 2014-04-21 NOTE — Progress Notes (Signed)
Patient ID: Krista Chapman, female   DOB: [birth year], 57 y.o.   MRN: [id number] 2 Days Post-Op  Subjective: Patient developed increased right upper quadrant and back pain last night and CT scan was obtained showing evidence of duodenal perforation as described below. This morning her pain is essentially unchanged. No better or worse. Pain in her epigastrium right upper quadrant and back. Her abdomen feels bloated. No nausea or vomiting.  Objective: Vital signs in last 24 hours: Temp:  [98.6 F (37 C)-99.8 F (37.7 C)] 98.7 F (37.1 C) (07/14 0623) Pulse Rate:  [70-93] 93 (07/14 0623) Resp:  [16-22] 22 (07/14 1139) BP: (105-115)/(56-73) 111/72 mmHg (07/14 0623) SpO2:  [94 %-100 %] 98 % (07/14 1139) Last BM Date: 04/17/14  Intake/Output from previous day: 07/13 0701 - 07/14 0700 In: 2656.3 [I.V.:2656.3] Out: 850 [Urine:850] Intake/Output this shift:    General appearance: alert, cooperative, no distress and does not appear acutely ill or toxic Resp: clear to auscultation bilaterally and no increased work of breathing GI: mild to moderate distention. There is minimal left-sided abdominal tenderness with no guarding. There is significant right upper quadrant and right flank tenderness with guarding.  Lab Results:   Recent Labs  04/20/14 1905 04/21/14 0413  WBC 9.8 11.2*  HGB 14.5 14.0  HCT 42.3 41.8  PLT 155 168   BMET  Recent Labs  04/20/14 1905 04/21/14 0413  NA 139 137  K 3.9 3.8  CL 102 101  CO2 25 22  GLUCOSE 98 104*  BUN 7 9  CREATININE 1.03 0.94  CALCIUM 8.0* 7.5*     Studies/Results: Ct Abdomen Pelvis Wo Contrast  04/20/2014   CLINICAL DATA:  Abdominal pain after ERCP  EXAM: CT ABDOMEN AND PELVIS WITHOUT CONTRAST  TECHNIQUE: Multidetector CT imaging of the abdomen and pelvis was performed following the standard protocol without IV contrast.  COMPARISON:  06/09/2010  FINDINGS: BODY WALL: Unremarkable.  LOWER CHEST: Small bilateral pleural effusions with right  basilar atelectasis.  ABDOMEN/PELVIS:  Liver: No focal abnormality.  Biliary: Gallstones noted within the gallbladder. There is diffuse pneumobilia after recent ERCP. High-density material within the distal common bile duct is likely enteric contrast. There is extensive right retroperitoneal fluid, gas, and contrast extravasation, mainly collecting in the posterior para renal space with continuation into the pelvis. A defect is visible near the level of the distal common bile duct. Given the recent procedure, it is unlikely that any of these collections represent abscess. There is small volume ascites, partially imaged in the pelvis. No pneumoperitoneum within the upper abdomen.  Pancreas: Unremarkable.  Spleen: Unremarkable.  Adrenals: Unremarkable.  Kidneys and ureters: Displacement of the right kidney by the retroperitoneal fluid collection.  Bladder: Unremarkable.  Reproductive: Unremarkable.  Bowel: Luminal perforation about that the duodenum or biliary tree as above. Normal appendix.  Retroperitoneum: No mass or adenopathy.  Peritoneum: No ascites or pneumoperitoneum.  Vascular: No acute abnormality.  OSSEOUS: No acute abnormalities.  Critical Value/emergent results were called by telephone at the time of interpretation on 04/20/2014 at 11:08 PM to Dr. [name], who verbally acknowledged these results.  IMPRESSION: Perforation of the mid duodenum and/or distal common bile duct with extensive retroperitoneal fluid and gas. There is ascites but no pneumoperitoneum.   Electronically Signed   By: Tiburcio Pea M.D.   On: 04/20/2014 23:21    Anti-infectives: Anti-infectives   Start     Dose/Rate Route Frequency Ordered Stop   04/21/14 1400  piperacillin-tazobactam (ZOSYN) IVPB 3.375 g  3.375 g 12.5 mL/hr over 240 Minutes Intravenous 3 times per day 04/21/14 0235     04/21/14 1200  ceFAZolin (ANCEF) IVPB 2 g/50 mL premix     2 g 100 mL/hr over 30 Minutes Intravenous On call to O.R. 04/21/14 1126 04/22/14  0559   04/21/14 0245  piperacillin-tazobactam (ZOSYN) IVPB 3.375 g     3.375 g 100 mL/hr over 30 Minutes Intravenous  Once 04/21/14 0235 04/21/14 0328   04/21/14 0230  piperacillin-tazobactam (ZOSYN) IVPB 3.375 g  Status:  Discontinued     3.375 g 12.5 mL/hr over 240 Minutes Intravenous 3 times per day 04/21/14 0216 04/21/14 0235   04/19/14 1045  ciprofloxacin (CIPRO) IVPB 400 mg     400 mg 200 mL/hr over 60 Minutes Intravenous  Once 04/19/14 1035 04/19/14 1158      Assessment/Plan: s/p Procedure(s): ENDOSCOPIC RETROGRADE CHOLANGIOPANCREATOGRAPHY (ERCP) Duodenal perforation post ERCP. The patient has a worrisome appearing CT scan but appears very stable with normal vital signs, localized tenderness and minimally elevated white blood count. We had a long discussion this morning with the patient and her family with Dr. Dulce Sellarutlaw present and participating. We discussed potential options for management including expectant management with bowel rest, IV fluids and antibiotics versus surgical intervention which would consist of extensive drainage. The patient appears nontoxic and stable presently and I would recommend nonoperative management with close observation. We discussed pros and cons of each approach. We discussed there is no way to reliably predict the clinical course. The patient requested a second opinion from Dr. Daphine DeutscherMartin from our practice and we will ask him to review. Transfer to a local tertiary Medical Center was offered and discussed and at this point they would like to await second opinion here prior to considering transfer.    LOS: 4 days    Krista Chapman 04/21/2014

## 2014-04-21 NOTE — Progress Notes (Signed)
Spoke with A/C at 0040 am. Will reach MD ASAP and will continue to notify her with updates.Kenton KingfisherMills, Gabriella Woodhead [place]

## 2014-04-21 NOTE — Progress Notes (Signed)
GI MD paged again at 1235am. Still no call back. Spoke w/ A/C and on call triad hospitalist.  Spoke with [name] at 0100 am. She told this RN to page surgery if I had not heard from on call GI at 0115.  Surgery was paged at 0115. Waiting for call back.Kenton Kingfisher [place]

## 2014-04-21 NOTE — Progress Notes (Signed)
General Surgery - [facility]  Called by nurse Sharyl NimrodMeredith with results of urgent [facility].  Apparently patient had undergone ERCP with sphinterotomy on 04/19/2014.  Post procedure pain thought to be related to pancreatitis.  CT scan shows perforation of distal CBD at duodenum with pneumobilia and retroperitoneal fluid and gas.  Patient seen and examined.  VS are stable with low grade fever.  WBC from 04/20/2014 is normal.  Abdomen is diffusely tender with voluntary guarding.  Bowel sounds are present.  Ecchymosis noted RLQ from DVT prophylaxis.  Discussed CT findings and significance with patient and her son at the bedside, accompanied by nurse Sharyl NimrodMeredith and Margart SicklesLaura Epperson from nursing.  Explained CT findings, fact that pancreatitis seemed to be improving, and that pain likely due to distal CBD perforation as a consequence of ERCP with sphincterotomy.  Discussed possible needed interventions to include but not limited to endoscopy with stent placement, possible operative intervention with drainage and cholecystectomy.  Patient and son frustrated and asked several questions which I tried to answer, and which the nursing staff continued to try to explain and reassure.  Will discuss with GI and with my partner, Dr. Johna SheriffHoxworth, early this AM.  Patient clinically stable at this time.  Will make NPO (strict), begin IV antibiotics (Invanz), and stop DVT prophylaxis.  Velora Hecklerodd M. Alek Poncedeleon, MD, [facility] Office: [phone]

## 2014-04-21 NOTE — Progress Notes (Signed)
Dr.Gerkin from surgery came to see patient shortly after being paged. Still tried to contact GI around 2 am and phone service was no longer working.  Operator and [facility] were called for assistance locating another number. AC was able to get Dr. Hulen Shouts number for Dr. Gerrit Friends to reach.  AC, this RN, and Dr. Gerrit Friends spent a very long time speaking to family regarding options and discussing situation.  Patient's family remains very argumentative and irrational.  They remain very unhappy regarding care in the past 48 hours.  Family reports after hearing results of CT scan they want to go to another hospital.  Family was informed by [facility] that transfer could not happen tonight since she did not require emergency surgery.  Family has decided that they want Dr. Luretha Murphy to take over surgical care and if not they will be going to a different hospital.  Patient's vitals remain stable. Not necessary to send to step down per surgery.  Temperature is slightly elevated at 99.5 but is being checked around every 4 hours. I have talked to the [facility] and hospitalist numerous times throughout the night regarding patient care.  I have made several attempts to make patient comfortable throughout the night.  Patient refusing foley.  Patient one minute reports pain is better then reports that it has not changed much.  Patient is still using fentanyl PCA.  Family is very difficult to please and continues to make frequent demands and requests.  Will continue to monitor patient throughout the night and will notify MD of any changes.Kenton Kingfisher [place]

## 2014-04-21 NOTE — Progress Notes (Signed)
Krista Chapman called to say patient and son had made a decision concerning if patient would like to transfer facilities.  Spoke with son on the phone and son expresses that patient would like to remain here at [facility] is changed to Dr Daphine Deutscher.  Expressed to son that we would pass this on to the surgeons in the AM.  [name], NP called to express that patient could possibly want hospital transfer this am.

## 2014-04-21 NOTE — Progress Notes (Signed)
Called by Rn to come speak with patient and family concerning plan of care and concerns.  In to speak with patient and niece at bedside.  Patient and family voice concerns and frustration with plan of care thus far and feeling like on a "merry go Round."  Patient and family expresses uncertainty with multiple Dr's involved with care and having difference of opinions.  Spoke with patient concerning current plan of care.  Listened to patient and family concerns, answered questions and gave patient and family my name and number to call if any further concerns or questions.  RN Sharyl NimrodMeredith aware to call me should any further issues arise or concerns.  Patient and family verbalized understanding, patient with decreased pain at this time.  Instructed patient to call should the pain medicine no longer be effective.  Family at bedside.  Patient awaiting CT scan.

## 2014-04-21 NOTE — Progress Notes (Signed)
In to see patient.  Patient expresses no concerns at this time, and expresses pain level decreased at this time without movement.  Patient instructed to call should that change.  Patient resting in bed at this time.

## 2014-04-21 NOTE — Progress Notes (Signed)
General Surgery [facility]  Hospitalist has started Zosyn (instead of Invanz as I wrote in my note).  This will provide excellent coverage as well.  Will continue Zosyn.  Velora Heckler, MD, [facility] Office: [phone]

## 2014-04-21 NOTE — Progress Notes (Signed)
Wasted of fentanyl pca in sink. Witnessed by Kathlene November and [facility] Pharmacist.

## 2014-04-21 NOTE — Progress Notes (Signed)
Events of past 12 hours reviewed in detail.  Case discussed last night with Dr. Gerrit Friends and again this morning in detail with Dr. Johna Sheriff.  Patient has retroperitoneal free air with fluid tracking through to the pelvis, consistent with post-ERCP related retroperitoneal perforation.  Was started on antibiotics last night.  Patient still has abdominal pain, and is on PCA, but she is not toxic-appearing.  No peritoneal signs.  Slight leukocytosis and Tmax 99.8.  LFTs downtrending, except slight increase in bilirubin of 1.4.  Lipase has normalized.  Dr. Johna Sheriff and I had an extended discussion of about 20 minutes in duration together with the patient, her son, and her pastor in attendance.  We have advised continued medical management with bowel rest, intravenous fluids, antibiotics, and analgesics.  We have advised close observation and have advised to hold off on surgery unless she clinically declines.  Patient and son would like second surgical opinion, so Dr. Johna Sheriff will ask Dr. Daphine Deutscher to come by and see patient as well.

## 2014-04-21 NOTE — Progress Notes (Signed)
In to see patient with Dr Gerrit Friends and Sharyl Nimrod RN.  Dr Gerrit Friends discussed CT results with patient and patients son, and  answered questions.  Patient and son express great frustration and concerns about plan of care and where they should go from here. Listened to patient and son and answered questions. Both expressed being transferred to another facility if nothing was done here.  Explained that MD does not feel emergent surgery be done, course is anabiotic, IV fluids, and strict NPO. MD will discuss whether procedure would need to happen and what will happen in am.   Expressed to patient and son that they have the right to change hospital facilities should they no longer trust the care provided here.  Expressed that Three MD's had been in to see patient within last 12hrs, Hospitalitis, Gi, as well as Surgeon and was closely monitoring patients condition.  Patient and son would like to discuss options with family to decide if patient will change facilities. Told patient to let me know her decision and I would let the proper MD know.  Patient and son verbalized understanding.  Left patient and son to discuss options and their plan from here.  Spoke with Dr Gerrit Friends and nurse Sharyl Nimrod.  Also spoke with Dr Malena Edman office manager via phone and she expressed that Dr Dulce Sellar had viewed CT from home and no emergent OR case at this time.  Dr Dulce Sellar to call and speak with Dr Gerrit Friends.  Per office manager no change in plan of care at this time.  Verbalized to patient and son Dr Dulce Sellar had viewed CT and spoke with Dr Gerrit Friends and no change in plan of care at this time.  Patient and son still discussing plans at this point.

## 2014-04-21 NOTE — Progress Notes (Signed)
Received call from triad hospitalist K. Craige CottaKirby around 2340pm.  Critical results received and were called to her by radiologist Tiburcio PeaJonathan Watts, MD.  Craige CottaKirby notified me to page GI doctor on call.  GI was notified around 2345 pm.  Brief critical message was given to Diplomatic Services operational officersecretary.  No call back from MD, so MD was again paged around 1205 am.  Spoke with Diplomatic Services operational officersecretary a second time.  Still waiting for GI MD on call Dr. Madilyn FiremanHayes to call back.  Will continue to try to reach someone.Kenton KingfisherMills, Shantoya Geurts [place]

## 2014-04-21 NOTE — Progress Notes (Signed)
Patient ID: Krista Chapman, female   DOB: 04-10-1956, 58 y.o.   MRN: [id number]  Patient is reasonably comfortable this evening. Pain level is no worse. Up in chair.  Exam: BP 115/68  Pulse 83  Temp(Src) 99 F (37.2 C) (Oral)  Resp 18  Ht 5\' 6"  (1.676 m)  Wt 170 lb (77.111 kg)  BMI 27.45 kg/m2  SpO2 95% Urine output is good. General: Up in chair in no distress Abdomen: Mild left-sided and moderate to marked right-sided tenderness unchanged from exam this morning.  Assessment and plan: Duodenal perforation post ERCP. Stable at present. Continue current management of bowel rest and antibiotics.                                       Consider TNA as this may be a somewhat prolonged course.                                       Discussed with patient and her son and all questions answered.   Mariella Saa MD, FACS  04/21/2014, 6:58 PM

## 2014-04-21 NOTE — Progress Notes (Signed)
[facility] PROGRESS NOTE  NADIYAH ZEIS ZOX:[id number] DOB: 1956/01/17 DOA: 04/17/2014 PCP: Neldon Labella, MD  Assessment/Plan: #1 post ERCP duodenal perforation with retroperitoneal perforation Patient currently afebrile. No peritoneal signs. LFTs trending down. Lipase trending down. White count of 11.2. Continue empiric IV Zosyn, bowel rest, IV fluids, pain management. GI and general surgery following and appreciate input and recommendations.  #2 choledocholithiasis/acute pancreatitis Status post ERCP with stone removal. Patient with acute pancreatitis status post ERCP. Lipase levels trending down. Continue bowel rest, IV fluids, pain management, supportive care. Once the acute pancreatitis has resolved and patient improved and deemed stable possible cholecystectomy per general surgery. GI general surgery following.  #3 acute renal failure Likely secondary to prerenal azotemia. Improved with hydration. Follow.  #4 prophylaxis SCDs for DVT prophylaxis.  Code Status: Full Family Communication: Updated patient and son at bedside. Disposition Plan: Home when medically stable.   Consultants:  GI: Dr Dulce Sellar 04/18/14  CCS: Dr Johna Sheriff 04/18/14  Procedures:  ERCP with sphincterectomy/papillotomy and ERCP with removal of calculus/calculi 04/19/2014 per Dr. Dulce Sellar  CT abdomen and pelvis 04/20/2014  Abdominal ultrasound 04/17/2014  Antibiotics:  IV Zosyn 04/21/14  HPI/Subjective: Patient c/o diffuse abdominal pain.  Objective: Filed Vitals:   04/21/14 0838  BP:   Pulse:   Temp:   Resp: 16    Intake/Output Summary (Last 24 hours) at 04/21/14 1120 Last data filed at 04/21/14 0622  Gross per 24 hour  Intake 2656.25 ml  Output    850 ml  Net 1806.25 ml   Filed Weights   04/17/14 1835  Weight: 77.111 kg (170 lb)    Exam:   General:  NAD  Cardiovascular: RRR  Respiratory: CTAB  Abdomen: Soft/TTP diffusely/ND/+BS  Musculoskeletal: No c/c/e  Data  Reviewed: Basic Metabolic Panel:  Recent Labs Lab 04/17/14 1457 04/18/14 0403 04/20/14 0407 04/20/14 1905 04/21/14 0413  NA 137 141 140 139 137  K 4.1 4.5 4.0 3.9 3.8  CL 95* 105 102 102 101  CO2 27 24 27 25 22   GLUCOSE 141* 88 106* 98 104*  BUN 14 11 6 7 9   CREATININE 1.26* 1.12* 1.13* 1.03 0.94  CALCIUM 9.3 9.0 8.3* 8.0* 7.5*  MG  --  2.0  --   --   --    Liver Function Tests:  Recent Labs Lab 04/17/14 1457 04/18/14 0403 04/20/14 0407 04/20/14 1905 04/21/14 0413  AST 174* 347* 51* 36 31  ALT 93* 339* 147* 109* 85*  ALKPHOS 93 104 80 76 71  BILITOT 0.8 0.8 0.7 1.1 1.4*  PROT 6.9 6.0 5.7* 5.9* 5.4*  ALBUMIN 3.8 3.2* 3.0* 2.9* 2.5*    Recent Labs Lab 04/17/14 1457 04/19/14 1917 04/20/14 0407 04/20/14 1905 04/21/14 0413  LIPASE 43 1080* 206* 66* 50   No results found for this basename: AMMONIA,  in the last 168 hours CBC:  Recent Labs Lab 04/17/14 1457 04/18/14 0403 04/20/14 0407 04/20/14 1905 04/21/14 0413  WBC 9.9 5.5 10.1 9.8 11.2*  NEUTROABS 8.1*  --   --   --   --   HGB 14.1 12.8 13.1 14.5 14.0  HCT 40.3 36.7 39.3 42.3 41.8  MCV 90.2 90.8 92.3 92.0 91.7  PLT 196 170 161 155 168   Cardiac Enzymes: No results found for this basename: CKTOTAL, CKMB, CKMBINDEX, TROPONINI,  in the last 168 hours BNP (last 3 results) No results found for this basename: PROBNP,  in the last 8760 hours CBG: No results found for this basename: GLUCAP,  in the last 168 hours  Recent Results (from the past 240 hour(s))  CSF CULTURE     Status: None   Collection Time    04/16/14  9:35 AM      Result Value Ref Range Status   Gram Stain No WBC Seen   Final   Gram Stain No Organisms Seen   Final   Organism ID, Bacteria NO GROWTH 3 DAYS   Final  SURGICAL PCR SCREEN     Status: None   Collection Time    04/20/14  2:47 AM      Result Value Ref Range Status   MRSA, PCR NEGATIVE  NEGATIVE Final   Staphylococcus aureus NEGATIVE  NEGATIVE Final   Comment:             The Xpert SA Assay (FDA     approved for NASAL specimens     in patients over 57 years of age),     is one component of     a comprehensive surveillance     program.  Test performance has     been validated by [facility] for patients greater     than or equal to 57 year old.     It is not intended     to diagnose infection nor to     guide or monitor treatment.     Studies: Ct Abdomen Pelvis Wo Contrast  04/20/2014   CLINICAL DATA:  Abdominal pain after ERCP  EXAM: CT ABDOMEN AND PELVIS WITHOUT CONTRAST  TECHNIQUE: Multidetector CT imaging of the abdomen and pelvis was performed following the standard protocol without IV contrast.  COMPARISON:  06/09/2010  FINDINGS: BODY WALL: Unremarkable.  LOWER CHEST: Small bilateral pleural effusions with right basilar atelectasis.  ABDOMEN/PELVIS:  Liver: No focal abnormality.  Biliary: Gallstones noted within the gallbladder. There is diffuse pneumobilia after recent ERCP. High-density material within the distal common bile duct is likely enteric contrast. There is extensive right retroperitoneal fluid, gas, and contrast extravasation, mainly collecting in the posterior para renal space with continuation into the pelvis. A defect is visible near the level of the distal common bile duct. Given the recent procedure, it is unlikely that any of these collections represent abscess. There is small volume ascites, partially imaged in the pelvis. No pneumoperitoneum within the upper abdomen.  Pancreas: Unremarkable.  Spleen: Unremarkable.  Adrenals: Unremarkable.  Kidneys and ureters: Displacement of the right kidney by the retroperitoneal fluid collection.  Bladder: Unremarkable.  Reproductive: Unremarkable.  Bowel: Luminal perforation about that the duodenum or biliary tree as above. Normal appendix.  Retroperitoneum: No mass or adenopathy.  Peritoneum: No ascites or pneumoperitoneum.  Vascular: No acute abnormality.  OSSEOUS: No acute abnormalities.  Critical  Value/emergent results were called by telephone at the time of interpretation on 04/20/2014 at 11:08 PM to Dr. Craige CottaKirby, who verbally acknowledged these results.  IMPRESSION: Perforation of the mid duodenum and/or distal common bile duct with extensive retroperitoneal fluid and gas. There is ascites but no pneumoperitoneum.   Electronically Signed   By: Tiburcio PeaJonathan  Watts M.D.   On: 04/20/2014 23:21   Dg Ercp With Sphincterotomy  04/19/2014   CLINICAL DATA:  ERCP with sphincterotomy for common bile duct stones.  EXAM: ERCP  TECHNIQUE: Multiple spot images obtained with the fluoroscopic device and submitted for interpretation post-procedure.  FLUOROSCOPY TIME:  2 min, 43 second  COMPARISON:  Abdominal ultrasound - 04/17/2014; CT abdomen pelvis- 06/09/2010  FINDINGS: Six spot intraoperative  fluoroscopic images during ERCP are provided for review.  Initial image demonstrates an ERCP probe overlying the right upper abdominal quadrant. There is subsequent selective cannulation opacification of the common bile duct.  There are 2 discrete filling defects within the distal aspect of the CBD. There is instillation of a biliary sphincterotomy balloon within in the central aspect of the common bile duct.  Completion image demonstrates apparent retraction of the CBD stones.  There is minimal opacification of the gallbladder with multiple filling defects compatible with the cholelithiasis demonstrated on preceding abdominal ultrasound.  There is minimal opacification of the central aspect of the intrahepatic biliary tree which appears nondilated. There is no definitive opacification of the pancreatic duct.  IMPRESSION: 1. ERCP with stone retraction, biliary sweeping and presumed sphincterotomy as above. 2. Cholelithiasis as demonstrated on preceding abdominal ultrasound. These images were submitted for radiologic interpretation only. Please see the procedural report for the amount of contrast and the fluoroscopy time utilized.    Electronically Signed   By: Simonne Come M.D.   On: 04/19/2014 12:19    Scheduled Meds: . ketorolac  15 mg Intravenous Once  . morphine   Intravenous 6 times per day  . piperacillin-tazobactam (ZOSYN)  IV  3.375 g Intravenous 3 times per day   Continuous Infusions: . 0.9 % sodium chloride with kcl 125 mL/hr at 04/20/14 0845    Principal Problem:   Retroperitoneal air: Post ERCP retroperitoneal perforation Active Problems:   Common bile duct stone   Choledocholithiasis   ARF (acute renal failure)    Time spent: 35 MINS    [facility] MD [facility] Pager [phone]. If 7PM-7AM, please contact night-coverage at [email or web address], password [facility] 04/21/2014, 11:20 AM  LOS: 4 days

## 2014-04-22 LAB — CBC WITH DIFFERENTIAL/PLATELET
BASOS ABS: 0 10*3/uL (ref 0.0–0.1)
Basophils Relative: 0 % (ref 0–1)
EOS ABS: 0 10*3/uL (ref 0.0–0.7)
EOS PCT: 1 % (ref 0–5)
HCT: 38.1 % (ref 36.0–46.0)
HEMOGLOBIN: 13.1 g/dL (ref 12.0–15.0)
Lymphocytes Relative: 10 % — ABNORMAL LOW (ref 12–46)
Lymphs Abs: 0.9 10*3/uL (ref 0.7–4.0)
MCH: 31.3 pg (ref 26.0–34.0)
MCHC: 34.4 g/dL (ref 30.0–36.0)
MCV: 91.1 fL (ref 78.0–100.0)
MONO ABS: 0.5 10*3/uL (ref 0.1–1.0)
MONOS PCT: 6 % (ref 3–12)
NEUTROS ABS: 7 10*3/uL (ref 1.7–7.7)
Neutrophils Relative %: 83 % — ABNORMAL HIGH (ref 43–77)
Platelets: 175 10*3/uL (ref 150–400)
RBC: 4.18 MIL/uL (ref 3.87–5.11)
RDW: 12.9 % (ref 11.5–15.5)
WBC: 8.4 10*3/uL (ref 4.0–10.5)

## 2014-04-22 LAB — MAGNESIUM: Magnesium: 1.6 mg/dL (ref 1.5–2.5)

## 2014-04-22 LAB — GLUCOSE, CAPILLARY: Glucose-Capillary: 126 mg/dL — ABNORMAL HIGH (ref 70–99)

## 2014-04-22 LAB — COMPREHENSIVE METABOLIC PANEL
ALBUMIN: 2 g/dL — AB (ref 3.5–5.2)
ALK PHOS: 53 U/L (ref 39–117)
ALT: 52 U/L — AB (ref 0–35)
ANION GAP: 11 (ref 5–15)
AST: 21 U/L (ref 0–37)
BILIRUBIN TOTAL: 0.8 mg/dL (ref 0.3–1.2)
BUN: 12 mg/dL (ref 6–23)
CHLORIDE: 105 meq/L (ref 96–112)
CO2: 23 mEq/L (ref 19–32)
Calcium: 7.9 mg/dL — ABNORMAL LOW (ref 8.4–10.5)
Creatinine, Ser: 0.93 mg/dL (ref 0.50–1.10)
GFR calc Af Amer: 78 mL/min — ABNORMAL LOW (ref >90)
GFR calc non Af Amer: 67 mL/min — ABNORMAL LOW (ref >90)
Glucose, Bld: 131 mg/dL — ABNORMAL HIGH (ref 70–99)
Potassium: 4.3 mEq/L (ref 3.7–5.3)
SODIUM: 139 meq/L (ref 137–147)
Total Protein: 5.3 g/dL — ABNORMAL LOW (ref 6.0–8.3)

## 2014-04-22 LAB — OLIGOCLONAL BANDS, CSF + SERM

## 2014-04-22 LAB — LIPASE, BLOOD: Lipase: 32 U/L (ref 11–59)

## 2014-04-22 MED ORDER — SODIUM CHLORIDE 0.9 % IV SOLN
INTRAVENOUS | Status: DC
  Filled 2014-04-22 (×2): qty 1000

## 2014-04-22 MED ORDER — SODIUM CHLORIDE 0.9 % IJ SOLN: 10.0000 mL | INTRAMUSCULAR | Status: DC | PRN

## 2014-04-22 MED ORDER — INSULIN ASPART 100 UNIT/ML ~~LOC~~ SOLN
0.0000 [IU] | SUBCUTANEOUS | Status: DC
  Administered 2014-04-23 (×3): 2 [IU] via SUBCUTANEOUS
  Administered 2014-04-23: 3 [IU] via SUBCUTANEOUS
  Administered 2014-04-23 – 2014-04-24 (×3): 2 [IU] via SUBCUTANEOUS
  Administered 2014-04-24: 3 [IU] via SUBCUTANEOUS
  Administered 2014-04-25 – 2014-04-26 (×8): 2 [IU] via SUBCUTANEOUS
  Administered 2014-04-26: 3 [IU] via SUBCUTANEOUS
  Administered 2014-04-27 (×3): 2 [IU] via SUBCUTANEOUS
  Administered 2014-04-27: 3 [IU] via SUBCUTANEOUS
  Administered 2014-04-27: 2 [IU] via SUBCUTANEOUS

## 2014-04-22 MED ORDER — PANTOPRAZOLE SODIUM 40 MG IV SOLR
40.0000 mg | Freq: Two times a day (BID) | INTRAVENOUS | Status: DC
  Administered 2014-04-22 – 2014-04-27 (×11): 40 mg via INTRAVENOUS
  Filled 2014-04-22 (×12): qty 40

## 2014-04-22 MED ORDER — FAT EMULSION 20 % IV EMUL
250.0000 mL | INTRAVENOUS | Status: AC
  Administered 2014-04-22: 250 mL via INTRAVENOUS
  Filled 2014-04-22: qty 250

## 2014-04-22 MED ORDER — MAGNESIUM SULFATE 50 % IJ SOLN
3.0000 g | Freq: Once | INTRAVENOUS | Status: AC
  Administered 2014-04-22: 3 g via INTRAVENOUS
  Filled 2014-04-22: qty 6

## 2014-04-22 MED ORDER — TRACE MINERALS CR-CU-F-FE-I-MN-MO-SE-ZN IV SOLN
INTRAVENOUS | Status: AC
  Administered 2014-04-22: 18:00:00 via INTRAVENOUS
  Filled 2014-04-22: qty 1000

## 2014-04-22 NOTE — Progress Notes (Signed)
Peripherally Inserted Central Catheter/Midline Placement  The IV Nurse has discussed with the patient and/or persons authorized to consent for the patient, the purpose of this procedure and the potential benefits and risks involved with this procedure.  The benefits include less needle sticks, lab draws from the catheter and patient may be discharged home with the catheter.  Risks include, but not limited to, infection, bleeding, blood clot (thrombus formation), and puncture of an artery; nerve damage and irregular heat beat.  Alternatives to this procedure were also discussed.  PICC/Midline Placement Documentation        Krista Chapman, Krista Chapman 04/22/2014, 3:07 PM

## 2014-04-22 NOTE — Progress Notes (Signed)
Patient ID: Krista Chapman, female   DOB: 1956/03/16, 58 y.o.   MRN: [id number] 3 Days Post-Op  Subjective: Still has abdominal pain particularly with movement, some generalized but mostly in the right upper quadrant and right flank. This is no worse than yesterday. Better than 2 days ago. She has been up out of bed. No nausea or other complaints.  Objective: Vital signs in last 24 hours: Temp:  [98.1 F (36.7 C)-99.2 F (37.3 C)] 98.1 F (36.7 C) (07/15 0715) Pulse Rate:  [79-83] 82 (07/15 0715) Resp:  [16-22] 16 (07/15 0715) BP: (111-116)/(68-73) 111/73 mmHg (07/15 0715) SpO2:  [91 %-100 %] 100 % (07/15 0715) Last BM Date: 04/17/14  Intake/Output from previous day: 07/14 0701 - 07/15 0700 In: 1300 [I.V.:1250; IV Piggyback:50] Out: 1200 [Urine:1200] Intake/Output this shift: Total I/O In: -  Out: 100 [Urine:100]  General appearance: alert, cooperative and no significant distress in general he looks a little better than yesterday GI: moderately distended. Still quite tender in the right abdomen but maybe a little less than yesterday.  Lab Results:   Recent Labs  04/21/14 0413 04/22/14 0428  WBC 11.2* 8.4  HGB 14.0 13.1  HCT 41.8 38.1  PLT 168 175   BMET  Recent Labs  04/21/14 0413 04/22/14 0428  NA 137 139  K 3.8 4.3  CL 101 105  CO2 22 23  GLUCOSE 104* 131*  BUN 9 12  CREATININE 0.94 0.93  CALCIUM 7.5* 7.9*   Lab Results  Component Value Date   ALT 52* 04/22/2014   AST 21 04/22/2014   ALKPHOS 53 04/22/2014   BILITOT 0.8 04/22/2014   Lab Results  Component Value Date   LIPASE 32 04/22/2014     Studies/Results: Ct Abdomen Pelvis Wo Contrast  04/20/2014   CLINICAL DATA:  Abdominal pain after ERCP  EXAM: CT ABDOMEN AND PELVIS WITHOUT CONTRAST  TECHNIQUE: Multidetector CT imaging of the abdomen and pelvis was performed following the standard protocol without IV contrast.  COMPARISON:  06/09/2010  FINDINGS: BODY WALL: Unremarkable.  LOWER CHEST: Small bilateral  pleural effusions with right basilar atelectasis.  ABDOMEN/PELVIS:  Liver: No focal abnormality.  Biliary: Gallstones noted within the gallbladder. There is diffuse pneumobilia after recent ERCP. High-density material within the distal common bile duct is likely enteric contrast. There is extensive right retroperitoneal fluid, gas, and contrast extravasation, mainly collecting in the posterior para renal space with continuation into the pelvis. A defect is visible near the level of the distal common bile duct. Given the recent procedure, it is unlikely that any of these collections represent abscess. There is small volume ascites, partially imaged in the pelvis. No pneumoperitoneum within the upper abdomen.  Pancreas: Unremarkable.  Spleen: Unremarkable.  Adrenals: Unremarkable.  Kidneys and ureters: Displacement of the right kidney by the retroperitoneal fluid collection.  Bladder: Unremarkable.  Reproductive: Unremarkable.  Bowel: Luminal perforation about that the duodenum or biliary tree as above. Normal appendix.  Retroperitoneum: No mass or adenopathy.  Peritoneum: No ascites or pneumoperitoneum.  Vascular: No acute abnormality.  OSSEOUS: No acute abnormalities.  Critical Value/emergent results were called by telephone at the time of interpretation on 04/20/2014 at 11:08 PM to Dr. [name], who verbally acknowledged these results.  IMPRESSION: Perforation of the mid duodenum and/or distal common bile duct with extensive retroperitoneal fluid and gas. There is ascites but no pneumoperitoneum.   Electronically Signed   By: Tiburcio Pea M.D.   On: 04/20/2014 23:21    Anti-infectives: Anti-infectives  Start     Dose/Rate Route Frequency Ordered Stop   04/21/14 1400  piperacillin-tazobactam (ZOSYN) IVPB 3.375 g     3.375 g 12.5 mL/hr over 240 Minutes Intravenous 3 times per day 04/21/14 0235     04/21/14 1200  ceFAZolin (ANCEF) IVPB 2 g/50 mL premix     2 g 100 mL/hr over 30 Minutes Intravenous On call  to O.R. 04/21/14 1126 04/22/14 0559   04/21/14 0245  piperacillin-tazobactam (ZOSYN) IVPB 3.375 g     3.375 g 100 mL/hr over 30 Minutes Intravenous  Once 04/21/14 0235 04/21/14 0328   04/21/14 0230  piperacillin-tazobactam (ZOSYN) IVPB 3.375 g  Status:  Discontinued     3.375 g 12.5 mL/hr over 240 Minutes Intravenous 3 times per day 04/21/14 0216 04/21/14 0235   04/19/14 1045  ciprofloxacin (CIPRO) IVPB 400 mg     400 mg 200 mL/hr over 60 Minutes Intravenous  Once 04/19/14 1035 04/19/14 1158      Assessment/Plan: s/p Procedure(s): ENDOSCOPIC RETROGRADE CHOLANGIOPANCREATOGRAPHY (ERCP) Duodenal perforation post ERCP. She appears stable to slightly improved today. White blood count has normalized. Certainly no evidence of progressive sepsis. Would continue nonoperative management with bowel rest and antibiotics. Will start TNA as is not clear when she can begin her diet. All this discussed with patient and all her questions answered.   LOS: 5 days    Yashira Offenberger T 04/22/2014

## 2014-04-22 NOTE — Progress Notes (Signed)
PARENTERAL NUTRITION CONSULT NOTE - INITIAL  Pharmacy Consult for TPN Indication: Duodenal perforation post ERCP.  Allergies  Allergen Reactions  . Codeine Nausea Only    Patient Measurements: Height: 5\' 6"  (167.6 cm) Weight: 170 lb (77.111 kg) IBW/kg (Calculated) : 59.3 Adjusted Body Weight: 66kg  Vital Signs: Temp: 98.2 F (36.8 C) (07/14 2247) Temp src: Oral (07/14 2247) BP: 116/69 mmHg (07/14 2247) Pulse Rate: 79 (07/14 2247) Intake/Output from previous day: 07/14 0701 - 07/15 0700 In: 1300 [I.V.:1250; IV Piggyback:50] Out: 1200 [Urine:1200] Intake/Output from this shift:    Labs:  Recent Labs  04/20/14 1905 04/21/14 0413 04/22/14 0428  WBC 9.8 11.2* 8.4  HGB 14.5 14.0 13.1  HCT 42.3 41.8 38.1  PLT 155 168 175     Recent Labs  04/20/14 1905 04/21/14 0413 04/22/14 0428  NA 139 137 139  K 3.9 3.8 4.3  CL 102 101 105  CO2 25 22 23   GLUCOSE 98 104* 131*  BUN 7 9 12   CREATININE 1.03 0.94 0.93  CALCIUM 8.0* 7.5* 7.9*  MG  --   --  1.6  PROT 5.9* 5.4* 5.3*  ALBUMIN 2.9* 2.5* 2.0*  AST 36 31 21  ALT 109* 85* 52*  ALKPHOS 76 71 53  BILITOT 1.1 1.4* 0.8   Estimated Creatinine Clearance: 70 ml/min (by C-G formula based on Cr of 0.93).   No results found for this basename: GLUCAP,  in the last 72 hours  Medical History: Past Medical History  Diagnosis Date  . Gallstones   . Palpitation   . Constipation   . Hypothyroidism     Medications:  Scheduled:  . ketorolac  15 mg Intravenous Once  . morphine   Intravenous 6 times per day  . piperacillin-tazobactam (ZOSYN)  IV  3.375 g Intravenous 3 times per day   Infusions:  . 0.9 % sodium chloride with kcl 125 mL/hr at 04/22/14 2025   Insulin Requirements in the past 24 hours: None  Current Nutrition: NPO  IVF: NSw/10KCl at 159ml/hr  Assessment: 58yo F unwent ERCP w/ sphincterotomy 04/19/14, developed RUQ and epigastric pain and elevated lipase a few hours later. Pancreatitis was suspected  but lipase resolved and a CT showed perforation of the distal CBP at duodemum. Patient has remained non-toxic appearing so treating medically for now. Pharmacy is asked to start TPN to allow bowel rest. She was eating normally prior to this admission.   Glucose - 98-131. No hx DM.  Electrolytes - wnl, Corr Ca 9.5. No phos this admission.  LFTs - improved to wnl. Lipase wnl.  TGs - pending  Prealbumin - pending  Nutritional Goals:  RD recs: pending Clinimix E 5/15 at a goal rate of 66ml/hr + 20% fat emulsion at 16ml/hr to provide: 102g/day protein, 1928Kcal/day.  TPN Access: PICC being placed now. TPN day#: 1  Plan: At 1800 today:  Start Clinimix E 5/15 at 39ml/hr.  20% fat emulsion at 57ml/hr.  Plan to advance as tolerated to the goal rate.  TNA to contain standard multivitamins and trace elements.  Reduce IVF to 30ml/hr.  Add SSI moderate q4h .   TNA lab panels on Mondays & Thursdays.  F/u daily.  Charolotte Eke, PharmD, pager [phone]. 04/22/2014,7:52 AM

## 2014-04-22 NOTE — Progress Notes (Signed)
Patient still  not wanting PICC placement at this time.  Notified staff RN who will contact physician

## 2014-04-22 NOTE — Progress Notes (Signed)
Explained risks, benefits and alternatives to PICC insertion.  Patient wants to think about it for awhile before making a decision.  I will check back with patient after placing another PICC

## 2014-04-22 NOTE — Progress Notes (Signed)
No improvement (but no worsening) of patient's abdominal pain.  Labs and vital signs are encouragingly positive.  Patient and family request external review of records by surgeons at [facility].  I have conveyed this request directly to Dr. Johna Sheriff.  As of right now, patient and family do not request a transfer to a tertiary center.

## 2014-04-22 NOTE — Progress Notes (Signed)
INITIAL NUTRITION ASSESSMENT  DOCUMENTATION CODES Per approved criteria  -Not Applicable   INTERVENTION: -TPN per pharmacy -Diet advancement per MD -Will continue to monitor  NUTRITION DIAGNOSIS: Inadequate oral intake related to inability to eat as evidenced by NPO status.   Goal: TPN to meet >/= 90% of their estimated nutrition needs    Monitor:  TPN tolerance, diet order, GI profile, total protein/energy intake, labs, weight  Reason for Assessment: New TPN  58 y.o. female  Admitting Dx: Retroperitoneal air  ASSESSMENT: 58 year old female with past medical history of anxiety, recurrent epigastric and right upper quadrant abdominal pain who now presented to [facility] ED 04/17/2014 with more frequent episodes of pain. She described pain, intermittent, about 6/10 in intensity and radiating to her back. She had nausea but no reports of vomiting. No fevers or chills. No reports of blood in stool or urine.   -Pt denied significant changes in weight or appetite pta.  -Had been making diet modifications to lose weight. Diet recall indicates pt consuming 2-3 balanced meals/day. Consumes oatmeal w/berries for breakfast, smoothies, and had been increasing intake of organic produce and grass fed/nitrate free proteins -Usual weight is around 170 lbs. Pt had not been weighed since admit on 7/10; reweighed patient in bed for a new weight of 190 lbs. However, this is likely inaccurate as pt does not have fluid retention and has had only 1-2 days of clear liquid diet.  -Pt does have retroperitoneal air and noted abd distention, which may contribute to above usual weight -Pt underwent ERCP for removal of bile duct stone and experienced duodenal perforation and acute pancreatitis post ERCP. It is unclear to when pt will be able to tolerate diet advancement. Consulted for TPN -Per RN, pt will have PICC placed later today -Discussed with pharmacy. Noted minimal risk of refeeding and will likely tolerate  advancement to goal TPN w/out complications -Mg/K WNL -Lipase WNL, elevated LFT improving -Glucose slightly elevated at 131  Height: Ht Readings from Last 1 Encounters:  04/17/14 5\' 6"  (1.676 m)    Weight: Wt Readings from Last 1 Encounters:  04/17/14 170 lb (77.111 kg)    Ideal Body Weight: 130 lbs  % Ideal Body Weight: 130%  Wt Readings from Last 10 Encounters:  04/17/14 170 lb (77.111 kg)  04/17/14 170 lb (77.111 kg)  04/17/14 170 lb (77.111 kg)  03/18/14 178 lb (80.74 kg)  09/15/13 165 lb (74.844 kg)  05/10/11 165 lb (74.844 kg)    Usual Body Weight: 170 lbs  % Usual Body Weight: 100%  BMI:  Body mass index is 27.45 kg/(m^2).  Estimated Nutritional Needs: Kcal: 1800-2000 Protein: 90-105 gram Fluid: >/=1800 ml/daily  Skin: WDL  Diet Order: NPO  EDUCATION NEEDS: -No education needs identified at this time   Intake/Output Summary (Last 24 hours) at 04/22/14 1019 Last data filed at 04/22/14 0705  Gross per 24 hour  Intake   1300 ml  Output   1300 ml  Net      0 ml    Last BM: 7/10   Labs:   Recent Labs Lab 04/17/14 1457 04/18/14 0403  04/20/14 1905 04/21/14 0413 04/22/14 0428  NA 137 141  < > 139 137 139  K 4.1 4.5  < > 3.9 3.8 4.3  CL 95* 105  < > 102 101 105  CO2 27 24  < > 25 22 23   BUN 14 11  < > 7 9 12   CREATININE 1.26* 1.12*  < > 1.03  0.94 0.93  CALCIUM 9.3 9.0  < > 8.0* 7.5* 7.9*  MG  --  2.0  --   --   --  1.6  GLUCOSE 141* 88  < > 98 104* 131*  < > = values in this interval not displayed.  CBG (last 3)  No results found for this basename: GLUCAP,  in the last 72 hours  Scheduled Meds: . ketorolac  15 mg Intravenous Once  . magnesium sulfate 1 - 4 g bolus IVPB  3 g Intravenous Once  . morphine   Intravenous 6 times per day  . pantoprazole (PROTONIX) IV  40 mg Intravenous Q12H  . piperacillin-tazobactam (ZOSYN)  IV  3.375 g Intravenous 3 times per day    Continuous Infusions: . 0.9 % sodium chloride with kcl 125 mL/hr at  04/22/14 [id number]    Past Medical History  Diagnosis Date  . Gallstones   . Palpitation   . Constipation   . Hypothyroidism     Past Surgical History  Procedure Laterality Date  . Ankle surgery Left 2004  . Colonoscopy    . Ercp N/A 04/19/2014    Procedure: ENDOSCOPIC RETROGRADE CHOLANGIOPANCREATOGRAPHY (ERCP);  Surgeon: Willis ModenaWilliam Outlaw, MD;  Location: [facility];  Service: Endoscopy;  Laterality: N/A;    Lloyd HugerSarah F Efosa Treichler MS RD LDN Clinical Dietitian Pager:[phone]

## 2014-04-22 NOTE — Progress Notes (Signed)
[facility] PROGRESS NOTE  Krista Chapman ZOX:[id number] DOB: 04/15/1956 DOA: 04/17/2014 PCP: Neldon Labella, MD  Assessment/Plan: #1 post ERCP duodenal perforation with retroperitoneal perforation Patient currently afebrile. No peritoneal signs. LFTs trending down. Lipase trending down. White count of 8.4. Continue empiric IV Zosyn, bowel rest, IV fluids, pain management. GI and general surgery following and appreciate input and recommendations.  #2 choledocholithiasis/acute pancreatitis Status post ERCP with stone removal. Patient with acute pancreatitis status post ERCP. Lipase levels trending down. Continue bowel rest, IV fluids, pain management, supportive care. GI  And general surgery following.  #3 acute renal failure Likely secondary to prerenal azotemia. Improving with hydration. Follow.  #4 prophylaxis SCDs for DVT prophylaxis.  Code Status: Full Family Communication: Updated patient and son at bedside. Disposition Plan: Home when medically stable.   Consultants:  GI: Dr Dulce Sellar 04/18/14  CCS: Dr Johna Sheriff 04/18/14  Procedures:  ERCP with sphincterectomy/papillotomy and ERCP with removal of calculus/calculi 04/19/2014 per Dr. Dulce Sellar  CT abdomen and pelvis 04/20/2014  Abdominal ultrasound 04/17/2014  Antibiotics:  IV Zosyn 04/21/14  HPI/Subjective: Patient c/o diffuse abdominal pain. Patient states now with pain on movement, however pain not as severe as on presentation.  Objective: Filed Vitals:   04/22/14 0715  BP: 111/73  Pulse: 82  Temp: 98.1 F (36.7 C)  Resp: 16    Intake/Output Summary (Last 24 hours) at 04/22/14 1138 Last data filed at 04/22/14 0705  Gross per 24 hour  Intake   1300 ml  Output   1300 ml  Net      0 ml   Filed Weights   04/17/14 1835  Weight: 77.111 kg (170 lb)    Exam:   General:  NAD  Cardiovascular: RRR  Respiratory: CTAB  Abdomen: Soft/TTP RUQ and loweer abdomen. /ND/+BS  Musculoskeletal: No c/c/e  Data  Reviewed: Basic Metabolic Panel:  Recent Labs Lab 04/17/14 1457 04/18/14 0403 04/20/14 0407 04/20/14 1905 04/21/14 0413 04/22/14 0428  NA 137 141 140 139 137 139  K 4.1 4.5 4.0 3.9 3.8 4.3  CL 95* 105 102 102 101 105  CO2 27 24 27 25 22 23   GLUCOSE 141* 88 106* 98 104* 131*  BUN 14 11 6 7 9 12   CREATININE 1.26* 1.12* 1.13* 1.03 0.94 0.93  CALCIUM 9.3 9.0 8.3* 8.0* 7.5* 7.9*  MG  --  2.0  --   --   --  1.6   Liver Function Tests:  Recent Labs Lab 04/18/14 0403 04/20/14 0407 04/20/14 1905 04/21/14 0413 04/22/14 0428  AST 347* 51* 36 31 21  ALT 339* 147* 109* 85* 52*  ALKPHOS 104 80 76 71 53  BILITOT 0.8 0.7 1.1 1.4* 0.8  PROT 6.0 5.7* 5.9* 5.4* 5.3*  ALBUMIN 3.2* 3.0* 2.9* 2.5* 2.0*    Recent Labs Lab 04/19/14 1917 04/20/14 0407 04/20/14 1905 04/21/14 0413 04/22/14 0428  LIPASE 1080* 206* 66* 50 32   No results found for this basename: AMMONIA,  in the last 168 hours CBC:  Recent Labs Lab 04/17/14 1457 04/18/14 0403 04/20/14 0407 04/20/14 1905 04/21/14 0413 04/21/14 1330 04/22/14 0428  WBC 9.9 5.5 10.1 9.8 11.2*  --  8.4  NEUTROABS 8.1*  --   --   --   --  9.5* 7.0  HGB 14.1 12.8 13.1 14.5 14.0  --  13.1  HCT 40.3 36.7 39.3 42.3 41.8  --  38.1  MCV 90.2 90.8 92.3 92.0 91.7  --  91.1  PLT 196 170 161 155  168  --  175   Cardiac Enzymes: No results found for this basename: CKTOTAL, CKMB, CKMBINDEX, TROPONINI,  in the last 168 hours BNP (last 3 results) No results found for this basename: PROBNP,  in the last 8760 hours CBG: No results found for this basename: GLUCAP,  in the last 168 hours  Recent Results (from the past 240 hour(s))  CSF CULTURE     Status: None   Collection Time    04/16/14  9:35 AM      Result Value Ref Range Status   Gram Stain No WBC Seen   Final   Gram Stain No Organisms Seen   Final   Organism ID, Bacteria NO GROWTH 3 DAYS   Final  SURGICAL PCR SCREEN     Status: None   Collection Time    04/20/14  2:47 AM       Result Value Ref Range Status   MRSA, PCR NEGATIVE  NEGATIVE Final   Staphylococcus aureus NEGATIVE  NEGATIVE Final   Comment:            The Xpert SA Assay (FDA     approved for NASAL specimens     in patients over 57 years of age),     is one component of     a comprehensive surveillance     program.  Test performance has     been validated by [facility] for patients greater     than or equal to 57 year old.     It is not intended     to diagnose infection nor to     guide or monitor treatment.     Studies: Ct Abdomen Pelvis Wo Contrast  04/20/2014   CLINICAL DATA:  Abdominal pain after ERCP  EXAM: CT ABDOMEN AND PELVIS WITHOUT CONTRAST  TECHNIQUE: Multidetector CT imaging of the abdomen and pelvis was performed following the standard protocol without IV contrast.  COMPARISON:  06/09/2010  FINDINGS: BODY WALL: Unremarkable.  LOWER CHEST: Small bilateral pleural effusions with right basilar atelectasis.  ABDOMEN/PELVIS:  Liver: No focal abnormality.  Biliary: Gallstones noted within the gallbladder. There is diffuse pneumobilia after recent ERCP. High-density material within the distal common bile duct is likely enteric contrast. There is extensive right retroperitoneal fluid, gas, and contrast extravasation, mainly collecting in the posterior para renal space with continuation into the pelvis. A defect is visible near the level of the distal common bile duct. Given the recent procedure, it is unlikely that any of these collections represent abscess. There is small volume ascites, partially imaged in the pelvis. No pneumoperitoneum within the upper abdomen.  Pancreas: Unremarkable.  Spleen: Unremarkable.  Adrenals: Unremarkable.  Kidneys and ureters: Displacement of the right kidney by the retroperitoneal fluid collection.  Bladder: Unremarkable.  Reproductive: Unremarkable.  Bowel: Luminal perforation about that the duodenum or biliary tree as above. Normal appendix.  Retroperitoneum: No  mass or adenopathy.  Peritoneum: No ascites or pneumoperitoneum.  Vascular: No acute abnormality.  OSSEOUS: No acute abnormalities.  Critical Value/emergent results were called by telephone at the time of interpretation on 04/20/2014 at 11:08 PM to Dr. Craige CottaKirby, who verbally acknowledged these results.  IMPRESSION: Perforation of the mid duodenum and/or distal common bile duct with extensive retroperitoneal fluid and gas. There is ascites but no pneumoperitoneum.   Electronically Signed   By: Tiburcio PeaJonathan  Watts M.D.   On: 04/20/2014 23:21    Scheduled Meds: . ketorolac  15 mg Intravenous Once  .  magnesium sulfate 1 - 4 g bolus IVPB  3 g Intravenous Once  . morphine   Intravenous 6 times per day  . pantoprazole (PROTONIX) IV  40 mg Intravenous Q12H  . piperacillin-tazobactam (ZOSYN)  IV  3.375 g Intravenous 3 times per day   Continuous Infusions: . 0.9 % sodium chloride with kcl 125 mL/hr at 04/22/14 1610    Principal Problem:   Retroperitoneal air: Post ERCP retroperitoneal perforation Active Problems:   Common bile duct stone   Choledocholithiasis   ARF (acute renal failure)    Time spent: 35 MINS    [facility] MD [facility] Pager [phone]. If 7PM-7AM, please contact night-coverage at [email or web address], password [facility] 04/22/2014, 11:38 AM  LOS: 5 days

## 2014-04-23 LAB — TRIGLYCERIDES: Triglycerides: 104 mg/dL (ref <150)

## 2014-04-23 LAB — COMPREHENSIVE METABOLIC PANEL
ALT: 37 U/L — AB (ref 0–35)
AST: 17 U/L (ref 0–37)
Albumin: 2.1 g/dL — ABNORMAL LOW (ref 3.5–5.2)
Alkaline Phosphatase: 49 U/L (ref 39–117)
Anion gap: 9 (ref 5–15)
BUN: 10 mg/dL (ref 6–23)
CO2: 25 mEq/L (ref 19–32)
CREATININE: 0.68 mg/dL (ref 0.50–1.10)
Calcium: 8 mg/dL — ABNORMAL LOW (ref 8.4–10.5)
Chloride: 105 mEq/L (ref 96–112)
GFR calc non Af Amer: >90 mL/min (ref >90)
Glucose, Bld: 148 mg/dL — ABNORMAL HIGH (ref 70–99)
Potassium: 3.9 mEq/L (ref 3.7–5.3)
SODIUM: 139 meq/L (ref 137–147)
TOTAL PROTEIN: 5.5 g/dL — AB (ref 6.0–8.3)
Total Bilirubin: 0.7 mg/dL (ref 0.3–1.2)

## 2014-04-23 LAB — GLUCOSE, CAPILLARY
GLUCOSE-CAPILLARY: 154 mg/dL — AB (ref 70–99)
GLUCOSE-CAPILLARY: 149 mg/dL — AB (ref 70–99)
Glucose-Capillary: 140 mg/dL — ABNORMAL HIGH (ref 70–99)
Glucose-Capillary: 124 mg/dL — ABNORMAL HIGH (ref 70–99)
Glucose-Capillary: 125 mg/dL — ABNORMAL HIGH (ref 70–99)
Glucose-Capillary: 138 mg/dL — ABNORMAL HIGH (ref 70–99)

## 2014-04-23 LAB — MAGNESIUM: Magnesium: 2.3 mg/dL (ref 1.5–2.5)

## 2014-04-23 LAB — CBC
HCT: 36.6 % (ref 36.0–46.0)
Hemoglobin: 12.6 g/dL (ref 12.0–15.0)
MCH: 31.3 pg (ref 26.0–34.0)
MCHC: 34.4 g/dL (ref 30.0–36.0)
MCV: 90.8 fL (ref 78.0–100.0)
PLATELETS: 221 10*3/uL (ref 150–400)
RBC: 4.03 MIL/uL (ref 3.87–5.11)
RDW: 13.1 % (ref 11.5–15.5)
WBC: 8.9 10*3/uL (ref 4.0–10.5)

## 2014-04-23 LAB — DIFFERENTIAL
BASOS PCT: 0 % (ref 0–1)
Basophils Absolute: 0 10*3/uL (ref 0.0–0.1)
Eosinophils Absolute: 0.4 10*3/uL (ref 0.0–0.7)
Eosinophils Relative: 4 % (ref 0–5)
LYMPHS ABS: 0.8 10*3/uL (ref 0.7–4.0)
Lymphocytes Relative: 9 % — ABNORMAL LOW (ref 12–46)
Monocytes Absolute: 0.6 10*3/uL (ref 0.1–1.0)
Monocytes Relative: 7 % (ref 3–12)
NEUTROS PCT: 80 % — AB (ref 43–77)
Neutro Abs: 7.1 10*3/uL (ref 1.7–7.7)

## 2014-04-23 LAB — PHOSPHORUS: PHOSPHORUS: 1.3 mg/dL — AB (ref 2.3–4.6)

## 2014-04-23 LAB — PREALBUMIN: PREALBUMIN: 6 mg/dL — AB (ref 17.0–34.0)

## 2014-04-23 MED ORDER — FUROSEMIDE 10 MG/ML IJ SOLN
40.0000 mg | Freq: Once | INTRAMUSCULAR | Status: AC
  Administered 2014-04-23: 40 mg via INTRAVENOUS
  Filled 2014-04-23: qty 4

## 2014-04-23 MED ORDER — TRACE MINERALS CR-CU-F-FE-I-MN-MO-SE-ZN IV SOLN
INTRAVENOUS | Status: AC
  Administered 2014-04-23: 18:00:00 via INTRAVENOUS
  Filled 2014-04-23: qty 1000

## 2014-04-23 MED ORDER — POTASSIUM PHOSPHATES 15 MMOLE/5ML IV SOLN
20.0000 mmol | Freq: Once | INTRAVENOUS | Status: AC
  Administered 2014-04-23: 20 mmol via INTRAVENOUS
  Filled 2014-04-23: qty 6.67

## 2014-04-23 MED ORDER — FAT EMULSION 20 % IV EMUL
250.0000 mL | INTRAVENOUS | Status: AC
  Administered 2014-04-23: 250 mL via INTRAVENOUS
  Filled 2014-04-23: qty 250

## 2014-04-23 MED ORDER — SODIUM CHLORIDE 0.9 % IV SOLN
INTRAVENOUS | Status: DC
  Administered 2014-04-23: 15:00:00 via INTRAVENOUS

## 2014-04-23 NOTE — Progress Notes (Signed)
Patient at the beginning of the shift last night was under the impression that she would be transferring to [facility] hospital either that night or in the am.  I spoke with [facility] transfer RN Lilian Coma.  She said that patient's chart has not even been reviewed, there is no bed currently, and MD has not approved to accept patient yet.  RN at [facility] said they have not been able to review the shared records.  She said they will need assistance in am with trying to get patient information and access to "care everywhere".  Also will need to be able to see images.  Dr. Cira Servant is the MD that will be reviewing the chart.  The number for the Nurse Transfer Center is [phone].  Updated Director and Day shift RN with this information so that they can follow up on this issue.Krista Chapman

## 2014-04-23 NOTE — Progress Notes (Addendum)
NUTRITION FOLLOW UP  Intervention:   -TPN per pharmacy -Diet advancement per MD -Will continue to monitor  Nutrition Dx:   Inadequate oral intake related to inability to eat as evidenced by NPO status- ongoing    Goal:   TPN to meet >/= 90% of their estimated nutrition needs; progressing   Monitor:   TPN tolerance, diet order, GI profile, total protein/energy intake, labs, weight    Assessment:   7/15: -Pt denied significant changes in weight or appetite pta.  -Had been making diet modifications to lose weight. Diet recall indicates pt consuming 2-3 balanced meals/day. Consumes oatmeal w/berries for breakfast, smoothies, and had been increasing intake of organic produce and grass fed/nitrate free proteins  -Usual weight is around 170 lbs. Pt had not been weighed since admit on 7/10; reweighed patient in bed for a new weight of 190 lbs. However, this is likely inaccurate as pt does not have fluid retention and has had only 1-2 days of clear liquid diet.  -Pt does have retroperitoneal air and noted abd distention, which may contribute to above usual weight  -Pt underwent ERCP for removal of bile duct stone and experienced duodenal perforation and acute pancreatitis post ERCP. It is unclear to when pt will be able to tolerate diet advancement. Consulted for TPN  -Per RN, pt will have PICC placed later today  -Discussed with pharmacy. Noted minimal risk of refeeding and will likely tolerate advancement to goal TPN w/out complications  -Mg/K WNL  -Lipase WNL, elevated LFT improving  -Glucose slightly elevated at 131  7/16: -Pt was apprehensive at first; however allowed to have PICC line placed on 7/15 -Pharmacy recommend to continue with Clinimix E 5/15 at 40 ml/hr, with plans to advance to goal rate of 83 ml/hr with 20% fat emulsion once electrolytes repleted -At goal rate, Cinimix E 5/15 will provide 1912 kcal (100% est kcal needs) and 100 gram protein (100% est protein needs) -Phos  low, being repleted -CBG's < 150 mg/dL -RN did note concern for +1 Edema in foot, currently +2800 ml fluid.  -Pt to remain NPO, with no improvement or worsening of abd pain. Pt noted some abd bloating.  Height: Ht Readings from Last 1 Encounters:  04/17/14 5\' 6"  (1.676 m)    Weight Status:   Wt Readings from Last 1 Encounters:  04/17/14 170 lb (77.111 kg)    Re-estimated needs:  Kcal: 1800-2000  Protein: 90-105 gram  Fluid: >/=1800 ml/daily   Skin: WDL  Diet Order: NPO   Intake/Output Summary (Last 24 hours) at 04/23/14 1514 Last data filed at 04/23/14 1350  Gross per 24 hour  Intake 3670.87 ml  Output    850 ml  Net 2820.87 ml    Last BM: 7/10   Labs:   Recent Labs Lab 04/18/14 0403  04/21/14 0413 04/22/14 0428 04/23/14 0525  NA 141  < > 137 139 139  K 4.5  < > 3.8 4.3 3.9  CL 105  < > 101 105 105  CO2 24  < > 22 23 25   BUN 11  < > 9 12 10   CREATININE 1.12*  < > 0.94 0.93 0.68  CALCIUM 9.0  < > 7.5* 7.9* 8.0*  MG 2.0  --   --  1.6 2.3  PHOS  --   --   --   --  1.3*  GLUCOSE 88  < > 104* 131* 148*  < > = values in this interval not displayed.  CBG (last  3)   Recent Labs  04/23/14 0425 04/23/14 0848 04/23/14 1217  GLUCAP 154* 124* 149*    Scheduled Meds: . insulin aspart  0-15 Units Subcutaneous 6 times per day  . morphine   Intravenous 6 times per day  . pantoprazole (PROTONIX) IV  40 mg Intravenous Q12H  . piperacillin-tazobactam (ZOSYN)  IV  3.375 g Intravenous 3 times per day    Continuous Infusions: . sodium chloride 10 mL/hr at 04/23/14 1430  . Marland KitchenTPN (CLINIMIX-E) Adult 40 mL/hr at 04/22/14 1745   And  . fat emulsion 250 mL (04/22/14 1746)  . Marland KitchenTPN (CLINIMIX-E) Adult     And  . fat emulsion      Lloyd Huger MS RD LDN Clinical Dietitian Pager:[phone]

## 2014-04-23 NOTE — Progress Notes (Signed)
Patient's pain persists, about same (but not worse). Maybe a little better since she's sitting up in a chair more of the time.  Labs and vital signs are encouragingly positive.  Records from here are being sent to [facility] for review, this is in process.  Would continue TPN, antibiotics, bowel rest for now.  About 20 minutes were spent today discussing case directly with patient and her son.

## 2014-04-23 NOTE — Progress Notes (Signed)
Pt was sitting up in chair when I arrived. Pt's son and cousin were bedside. Pt said she was feeling btr today. She had taken pain med and was in and out of sleep. Prior to end of visit, she wanted prayer. Son and cousin joined and were thankful.  Post visit in room - pt's cousin spoke to w/me aside and said she is still concerned about pt.  When asked about concerns, she said they still can't do surgery and described complications in her endoscopy.  Pt nor son indicated any concerns during our visit in the room.  Conversation w/cousin was when she confronted me outside of pt room.  04/23/14 1300  Clinical Encounter Type  Visited With Patient and family together

## 2014-04-23 NOTE — Progress Notes (Signed)
Patient with complaint of increased swelling in legs, especially left foot.  Patient now with 1+ pitting edema in foot compared to no edema this am.  Patient also stating that abdomen feels more bloated.  Patient has had 1,979 cc intake and only 375 cc urine ouput.  Urine more concentrated than earlier.  Dr. Janee Morn notified and orders given.  Allayne Butcher [facility]  04/23/2014 2:00 PM

## 2014-04-23 NOTE — Progress Notes (Signed)
[facility] PROGRESS NOTE  Krista Chapman ZOX:[id number] DOB: 12/16/1955 DOA: 04/17/2014 PCP: Neldon LabellaMILLER,LISA LYNN, MD  Assessment/Plan: #1 post ERCP duodenal perforation with retroperitoneal perforation Patient currently afebrile. No peritoneal signs. LFTs trending down. Lipase trending down. White count of 8.9. Continue empiric IV Zosyn, bowel rest, IV fluids, pain management. GI and general surgery following and appreciate input and recommendations. [facility] per patient request and GI managing it.  #2 choledocholithiasis/acute pancreatitis Status post ERCP with stone removal. Patient with acute pancreatitis status post ERCP. Lipase levels trending down. Continue bowel rest, IV fluids, pain management, supportive care. GI  And general surgery following.  #3 acute renal failure Likely secondary to prerenal azotemia. Improving with hydration. Follow.  #4 prophylaxis SCDs for DVT prophylaxis.  Code Status: Full Family Communication: Updated patient at bedside. Disposition Plan: Home when medically stable.   Consultants:  GI: Dr Dulce Sellarutlaw 04/18/14  CCS: Dr Johna SheriffHoxworth 04/18/14  Procedures:  ERCP with sphincterectomy/papillotomy and ERCP with removal of calculus/calculi 04/19/2014 per Dr. Dulce Sellarutlaw  CT abdomen and pelvis 04/20/2014  Abdominal ultrasound 04/17/2014  Antibiotics:  IV Zosyn 04/21/14  HPI/Subjective: Patient c/o diffuse abdominal pain no no significant change however ambulating hallway gingerly.   Objective: Filed Vitals:   04/23/14 0751  BP:   Pulse:   Temp:   Resp: 18    Intake/Output Summary (Last 24 hours) at 04/23/14 0953 Last data filed at 04/23/14 0500  Gross per 24 hour  Intake 1641.7 ml  Output   1000 ml  Net  641.7 ml   Filed Weights   04/17/14 1835  Weight: 77.111 kg (170 lb)    Exam:   General:  NAD  Cardiovascular: RRR  Respiratory: CTAB  Abdomen: Soft/less TTP RUQ and lower abdomen.  /ND/+BS  Musculoskeletal: No c/c/e  Data Reviewed: Basic Metabolic Panel:  Recent Labs Lab 04/17/14 1457 04/18/14 0403 04/20/14 0407 04/20/14 1905 04/21/14 0413 04/22/14 0428 04/23/14 0525  NA 137 141 140 139 137 139 139  K 4.1 4.5 4.0 3.9 3.8 4.3 3.9  CL 95* 105 102 102 101 105 105  CO2 27 24 27 25 22 23 25   GLUCOSE 141* 88 106* 98 104* 131* 148*  BUN 14 11 6 7 9 12 10   CREATININE 1.26* 1.12* 1.13* 1.03 0.94 0.93 0.68  CALCIUM 9.3 9.0 8.3* 8.0* 7.5* 7.9* 8.0*  MG  --  2.0  --   --   --  1.6 2.3  PHOS  --   --   --   --   --   --  1.3*   Liver Function Tests:  Recent Labs Lab 04/20/14 0407 04/20/14 1905 04/21/14 0413 04/22/14 0428 04/23/14 0525  AST 51* 36 31 21 17   ALT 147* 109* 85* 52* 37*  ALKPHOS 80 76 71 53 49  BILITOT 0.7 1.1 1.4* 0.8 0.7  PROT 5.7* 5.9* 5.4* 5.3* 5.5*  ALBUMIN 3.0* 2.9* 2.5* 2.0* 2.1*    Recent Labs Lab 04/19/14 1917 04/20/14 0407 04/20/14 1905 04/21/14 0413 04/22/14 0428  LIPASE 1080* 206* 66* 50 32   No results found for this basename: AMMONIA,  in the last 168 hours CBC:  Recent Labs Lab 04/17/14 1457  04/20/14 0407 04/20/14 1905 04/21/14 0413 04/21/14 1330 04/22/14 0428 04/23/14 0525  WBC 9.9  < > 10.1 9.8 11.2*  --  8.4 8.9  NEUTROABS 8.1*  --   --   --   --  9.5* 7.0 7.1  HGB 14.1  < >  13.1 14.5 14.0  --  13.1 12.6  HCT 40.3  < > 39.3 42.3 41.8  --  38.1 36.6  MCV 90.2  < > 92.3 92.0 91.7  --  91.1 90.8  PLT 196  < > 161 155 168  --  175 221  < > = values in this interval not displayed. Cardiac Enzymes: No results found for this basename: CKTOTAL, CKMB, CKMBINDEX, TROPONINI,  in the last 168 hours BNP (last 3 results) No results found for this basename: PROBNP,  in the last 8760 hours CBG:  Recent Labs Lab 04/22/14 2051 04/23/14 0115 04/23/14 0425 04/23/14 0848  GLUCAP 126* 140* 154* 124*    Recent Results (from the past 240 hour(s))  CSF CULTURE     Status: None   Collection Time    04/16/14  9:35  AM      Result Value Ref Range Status   Gram Stain No WBC Seen   Final   Gram Stain No Organisms Seen   Final   Organism ID, Bacteria NO GROWTH 3 DAYS   Final  SURGICAL PCR SCREEN     Status: None   Collection Time    04/20/14  2:47 AM      Result Value Ref Range Status   MRSA, PCR NEGATIVE  NEGATIVE Final   Staphylococcus aureus NEGATIVE  NEGATIVE Final   Comment:            The Xpert SA Assay (FDA     approved for NASAL specimens     in patients over 51 years of age),     is one component of     a comprehensive surveillance     program.  Test performance has     been validated by [employer] for patients greater     than or equal to 34 year old.     It is not intended     to diagnose infection nor to     guide or monitor treatment.     Studies: No results found.  Scheduled Meds: . insulin aspart  0-15 Units Subcutaneous 6 times per day  . ketorolac  15 mg Intravenous Once  . morphine   Intravenous 6 times per day  . pantoprazole (PROTONIX) IV  40 mg Intravenous Q12H  . piperacillin-tazobactam (ZOSYN)  IV  3.375 g Intravenous 3 times per day  . potassium phosphate IVPB (mmol)  20 mmol Intravenous Once   Continuous Infusions: . Marland KitchenTPN (CLINIMIX-E) Adult 40 mL/hr at 04/22/14 1745   And  . fat emulsion 250 mL (04/22/14 1746)  . 0.9 % sodium chloride with kcl 75 mL/hr at 04/22/14 1836    Principal Problem:   Retroperitoneal air: Post ERCP retroperitoneal perforation Active Problems:   Common bile duct stone   Choledocholithiasis   ARF (acute renal failure)    Time spent: 35 MINS    [facility] MD [facility] Pager [phone]. If 7PM-7AM, please contact night-coverage at [email or web address], password [email or web address] 04/23/2014, 9:53 AM  LOS: 6 days

## 2014-04-23 NOTE — Progress Notes (Signed)
Patient has progressed well this shift with activity.  She has sat up in the chair most of this shift.  She also ambulated in the hallway x 2 and tolerated well.  Patient states she is more comfortable in the chair than in the bed and may sleep in the chair.  I placed a Geomat cushion in the chair and educated patient to move frequently in the chair to relieve pressure from her sacrum.  She verbalized understanding.  Patient received one time dose of IV lasix and she has already voided 450 cc urine.  Allayne Butcher [facility]  04/23/2014  3:53 PM

## 2014-04-23 NOTE — Progress Notes (Signed)
Patient ID: Krista Chapman, female   DOB: 10/20/1955, 58 y.o.   MRN: [id number] 4 Days Post-Op  Subjective: Up in chair, has been walking in halls.  A lot of pain with movement, comfortable at rest. No nausea, no BM or flatus.  Overall thinks pain is about the same   Objective: Vital signs in last 24 hours: Temp:  [98.5 F (36.9 C)-99.2 F (37.3 C)] 98.7 F (37.1 C) (07/16 0730) Pulse Rate:  [85-99] 85 (07/16 0730) Resp:  [16-28] 28 (07/16 1234) BP: (131-133)/(72-80) 133/72 mmHg (07/16 0730) SpO2:  [95 %-100 %] 98 % (07/16 1234) Last BM Date: 04/17/14  Intake/Output from previous day: 07/15 0701 - 07/16 0700 In: 1691.7 [I.V.:879.4; IV Piggyback:250; TPN:562.3] Out: 1100 [Urine:1100] Intake/Output this shift: Total I/O In: 506.7 [IV Piggyback:506.7] Out: 300 [Urine:300]  General appearance: alert, cooperative and no distress GI: Moderate distention.  RUQ and right flank tenderness with some guarding.  Rest of abdomen relatively non tender Extremities: Trace pedal edema  Lab Results:   Recent Labs  04/22/14 0428 04/23/14 0525  WBC 8.4 8.9  HGB 13.1 12.6  HCT 38.1 36.6  PLT 175 221   BMET  Recent Labs  04/22/14 0428 04/23/14 0525  NA 139 139  K 4.3 3.9  CL 105 105  CO2 23 25  GLUCOSE 131* 148*  BUN 12 10  CREATININE 0.93 0.68  CALCIUM 7.9* 8.0*     Studies/Results: No results found.  Anti-infectives: Anti-infectives   Start     Dose/Rate Route Frequency Ordered Stop   04/21/14 1400  piperacillin-tazobactam (ZOSYN) IVPB 3.375 g     3.375 g 12.5 mL/hr over 240 Minutes Intravenous 3 times per day 04/21/14 0235     04/21/14 1200  ceFAZolin (ANCEF) IVPB 2 g/50 mL premix     2 g 100 mL/hr over 30 Minutes Intravenous On call to O.R. 04/21/14 1126 04/22/14 0559   04/21/14 0245  piperacillin-tazobactam (ZOSYN) IVPB 3.375 g     3.375 g 100 mL/hr over 30 Minutes Intravenous  Once 04/21/14 0235 04/21/14 0328   04/21/14 0230  piperacillin-tazobactam (ZOSYN) IVPB  3.375 g  Status:  Discontinued     3.375 g 12.5 mL/hr over 240 Minutes Intravenous 3 times per day 04/21/14 0216 04/21/14 0235   04/19/14 1045  ciprofloxacin (CIPRO) IVPB 400 mg     400 mg 200 mL/hr over 60 Minutes Intravenous  Once 04/19/14 1035 04/19/14 1158      Assessment/Plan: s/p Procedure(s): ENDOSCOPIC RETROGRADE CHOLANGIOPANCREATOGRAPHY (ERCP) Duodenal perforation, 4 days post injury.  Remains stable to slightly improved. Continue bowel rest, TNA, abx.  Repeat CT tomorrow. Plan discussed with patient and son    LOS: 6 days    Krista Chapman T 04/23/2014

## 2014-04-23 NOTE — Progress Notes (Signed)
PARENTERAL NUTRITION CONSULT NOTE - Follow Up  Pharmacy Consult for TPN Indication: Duodenal perforation post ERCP.  Allergies  Allergen Reactions  . Codeine Nausea Only   Patient Measurements: Height: 5\' 6"  (167.6 cm) Weight: 170 lb (77.111 kg) IBW/kg (Calculated) : 59.3 Adjusted Body Weight: 66kg  Vital Signs: Temp: 98.7 F (37.1 C) (07/16 0730) Temp src: Oral (07/16 0730) BP: 133/72 mmHg (07/16 0730) Pulse Rate: 85 (07/16 0730) Intake/Output from previous day: 07/15 0701 - 07/16 0700 In: 1641.7 [I.V.:879.4; IV Piggyback:200; TPN:562.3] Out: 1100 [Urine:1100] Intake/Output from this shift:    Labs:  Recent Labs  04/21/14 0413 04/22/14 0428 04/23/14 0525  WBC 11.2* 8.4 8.9  HGB 14.0 13.1 12.6  HCT 41.8 38.1 36.6  PLT 168 175 221    Recent Labs  04/21/14 0413 04/22/14 0428 04/23/14 0525  NA 137 139 139  K 3.8 4.3 3.9  CL 101 105 105  CO2 22 23 25   GLUCOSE 104* 131* 148*  BUN 9 12 10   CREATININE 0.94 0.93 0.68  CALCIUM 7.5* 7.9* 8.0*  MG  --  1.6 2.3  PHOS  --   --  1.3*  PROT 5.4* 5.3* 5.5*  ALBUMIN 2.5* 2.0* 2.1*  AST 31 21 17   ALT 85* 52* 37*  ALKPHOS 71 53 49  BILITOT 1.4* 0.8 0.7  TRIG  --   --  104   Estimated Creatinine Clearance: 81.3 ml/min (by C-G formula based on Cr of 0.68).    Recent Labs  04/23/14 0115 04/23/14 0425 04/23/14 0848  GLUCAP 140* 154* 124*   Medications:  Scheduled:  . insulin aspart  0-15 Units Subcutaneous 6 times per day  . ketorolac  15 mg Intravenous Once  . morphine   Intravenous 6 times per day  . pantoprazole (PROTONIX) IV  40 mg Intravenous Q12H  . piperacillin-tazobactam (ZOSYN)  IV  3.375 g Intravenous 3 times per day  . potassium phosphate IVPB (mmol)  20 mmol Intravenous Once   Infusions:  . Marland Kitchen.TPN (CLINIMIX-E) Adult 40 mL/hr at 04/22/14 1745   And  . fat emulsion 250 mL (04/22/14 1746)  . 0.9 % sodium chloride with kcl 75 mL/hr at 04/22/14 1836   Insulin Requirements in the past 24 hours:   7 units for CBG 126,140/154,124, serum glucose 148 this am  Current Nutrition: NPO Clinimix E 5/15 at 40 ml/hr Lipids 20% at 10 ml/hr  IVF: NS w/10KCl at 5775ml/hr  Assessment: 58yo F unwent ERCP w/ sphincterotomy 04/19/14, developed RUQ and epigastric pain and elevated lipase a few hours later. Pancreatitis was suspected but lipase resolved and a CT showed perforation of the distal CBP at duodemum. Patient has remained non-toxic appearing so treating medically for now. Pharmacy is asked to start TPN to allow bowel rest. She was eating normally prior to this admission.   Glucose: 154-124.  Electrolytes: wnl, except Phosphorus of 1.3, Corr Ca 9.1  LFTs:- improved to wnl. Lipase wnl.  TGs: 104  Prealbumin: pending  Potassium Phospate bolus 20 mmol x1 today  Nutritional Goals:  RD recs: Kcal 1800-2000, Protein 90-105 gm, Fluid requirement >/= 1800 ml all per 24 hr Clinimix E 5/15 at a goal rate of 1583ml/hr + 20% fat emulsion at 4710ml/hr to provide: 100g/day protein, 1912Kcal/day.  TPN Access: PICC being placed now. TPN day#: 2  Plan: At 1800 today:  Continue Clinimix E 5/15 at 6840ml/hr.  20% fat emulsion at 1910ml/hr.  Plan to advance as tolerated to the goal rate once electrolyes repleted  TNA to contain standard multivitamins and trace elements.  Continue IVF at 18ml/hr.  Add SSI moderate q4h .   TNA lab panels on Mondays & Thursdays.  F/u daily with BMET and Phosporus level in am  Otho Bellows PharmD Pager [phone] 04/23/2014, 10:57 AM

## 2014-04-24 ENCOUNTER — Telehealth: Payer: Self-pay | Admitting: *Deleted

## 2014-04-24 ENCOUNTER — Inpatient Hospital Stay (HOSPITAL_COMMUNITY): Payer: BC Managed Care – PPO

## 2014-04-24 DIAGNOSIS — R509 Fever, unspecified: Secondary | ICD-10-CM

## 2014-04-24 LAB — GLUCOSE, CAPILLARY
GLUCOSE-CAPILLARY: 154 mg/dL — AB (ref 70–99)
GLUCOSE-CAPILLARY: 116 mg/dL — AB (ref 70–99)
Glucose-Capillary: 106 mg/dL — ABNORMAL HIGH (ref 70–99)
Glucose-Capillary: 110 mg/dL — ABNORMAL HIGH (ref 70–99)
Glucose-Capillary: 116 mg/dL — ABNORMAL HIGH (ref 70–99)
Glucose-Capillary: 140 mg/dL — ABNORMAL HIGH (ref 70–99)

## 2014-04-24 LAB — CBC
HCT: 31.3 % — ABNORMAL LOW (ref 36.0–46.0)
Hemoglobin: 11.1 g/dL — ABNORMAL LOW (ref 12.0–15.0)
MCH: 32.1 pg (ref 26.0–34.0)
MCHC: 35.5 g/dL (ref 30.0–36.0)
MCV: 90.5 fL (ref 78.0–100.0)
PLATELETS: 205 10*3/uL (ref 150–400)
RBC: 3.46 MIL/uL — AB (ref 3.87–5.11)
RDW: 13.2 % (ref 11.5–15.5)
WBC: 8.4 10*3/uL (ref 4.0–10.5)

## 2014-04-24 LAB — COMPREHENSIVE METABOLIC PANEL
ALT: 27 U/L (ref 0–35)
AST: 17 U/L (ref 0–37)
Albumin: 2 g/dL — ABNORMAL LOW (ref 3.5–5.2)
Alkaline Phosphatase: 61 U/L (ref 39–117)
Anion gap: 9 (ref 5–15)
BUN: 12 mg/dL (ref 6–23)
CALCIUM: 8.1 mg/dL — AB (ref 8.4–10.5)
CO2: 29 meq/L (ref 19–32)
CREATININE: 0.79 mg/dL (ref 0.50–1.10)
Chloride: 99 mEq/L (ref 96–112)
GFR calc Af Amer: >90 mL/min (ref >90)
GLUCOSE: 119 mg/dL — AB (ref 70–99)
Potassium: 3.4 mEq/L — ABNORMAL LOW (ref 3.7–5.3)
Sodium: 137 mEq/L (ref 137–147)
Total Bilirubin: 0.7 mg/dL (ref 0.3–1.2)
Total Protein: 5.5 g/dL — ABNORMAL LOW (ref 6.0–8.3)

## 2014-04-24 LAB — LIPASE, BLOOD: Lipase: 68 U/L — ABNORMAL HIGH (ref 11–59)

## 2014-04-24 LAB — PHOSPHORUS: PHOSPHORUS: 2.2 mg/dL — AB (ref 2.3–4.6)

## 2014-04-24 LAB — HSV(HERPES SMPLX VRS)ABS-I+II(IGG)-CSF

## 2014-04-24 MED ORDER — TRACE MINERALS CR-CU-F-FE-I-MN-MO-SE-ZN IV SOLN
INTRAVENOUS | Status: AC
  Administered 2014-04-24: 18:00:00 via INTRAVENOUS
  Filled 2014-04-24: qty 2000

## 2014-04-24 MED ORDER — IOHEXOL 300 MG/ML  SOLN
100.0000 mL | Freq: Once | INTRAMUSCULAR | Status: AC | PRN
  Administered 2014-04-24: 100 mL via INTRAVENOUS

## 2014-04-24 MED ORDER — IOHEXOL 300 MG/ML  SOLN
25.0000 mL | INTRAMUSCULAR | Status: AC
  Administered 2014-04-24 (×2): 25 mL via ORAL

## 2014-04-24 MED ORDER — POTASSIUM PHOSPHATES 15 MMOLE/5ML IV SOLN
20.0000 mmol | Freq: Once | INTRAVENOUS | Status: AC
  Administered 2014-04-24: 20 mmol via INTRAVENOUS
  Filled 2014-04-24: qty 6.67

## 2014-04-24 MED ORDER — POTASSIUM CHLORIDE 10 MEQ/100ML IV SOLN
10.0000 meq | INTRAVENOUS | Status: AC
  Administered 2014-04-24 (×3): 10 meq via INTRAVENOUS
  Filled 2014-04-24 (×3): qty 100

## 2014-04-24 MED ORDER — FUROSEMIDE 10 MG/ML IJ SOLN
40.0000 mg | Freq: Once | INTRAMUSCULAR | Status: AC
  Administered 2014-04-24: 40 mg via INTRAVENOUS
  Filled 2014-04-24: qty 4

## 2014-04-24 MED ORDER — FAT EMULSION 20 % IV EMUL
250.0000 mL | INTRAVENOUS | Status: AC
  Administered 2014-04-24: 250 mL via INTRAVENOUS
  Filled 2014-04-24: qty 250

## 2014-04-24 MED ORDER — BISACODYL 10 MG RE SUPP
10.0000 mg | Freq: Every day | RECTAL | Status: DC | PRN
  Administered 2014-04-24 – 2014-04-26 (×2): 10 mg via RECTAL
  Filled 2014-04-24 (×2): qty 1

## 2014-04-24 NOTE — Telephone Encounter (Signed)
Message copied by Ardeth Sportsman on Fri Apr 24, 2014  1:50 PM ------      Message from: Ramond Marrow      Created: Fri Apr 24, 2014  8:51 AM       Please let her know her lumbar puncture was overall unremarkable with no signs of multiple sclerosis. Thanks ------

## 2014-04-24 NOTE — Progress Notes (Signed)
Patient ID: Krista Chapman, female   DOB: [birth year], 58 y.o.   MRN: [id number] 5 Days Post-Op  Subjective: Pain is better today. However she had some low-grade fever earlier and felt hot and flushed. Also concerned about persistent abdominal distention. No nausea or vomiting. No flatus or bowel movements.  Objective: Vital signs in last 24 hours: Temp:  [97.6 F (36.4 C)-100.4 F (38 C)] 99 F (37.2 C) (07/17 1532) Pulse Rate:  [86-99] 90 (07/17 1532) Resp:  [17-28] 17 (07/17 1532) BP: (107-117)/(59-75) 117/75 mmHg (07/17 1532) SpO2:  [96 %-100 %] 98 % (07/17 1532) Last BM Date: 04/17/14  Intake/Output from previous day: 07/16 0701 - 07/17 0700 In: 3186.1 [P.O.:480; I.V.:857.8; IV Piggyback:656.7; TPN:1191.7] Out: 2550 [Urine:2550] Intake/Output this shift: Total I/O In: 0  Out: 750 [Urine:750]  General appearance: alert, cooperative and no distress GI: mmoderately distended, no change. Bowel sounds hypoactive. Much less tender today minimal if any right sided tenderness. No guarding.  Lab Results:   Recent Labs  04/23/14 0525 04/24/14 0540  WBC 8.9 8.4  HGB 12.6 11.1*  HCT 36.6 31.3*  PLT 221 205   BMET  Recent Labs  04/23/14 0525 04/24/14 0540  NA 139 137  K 3.9 3.4*  CL 105 99  CO2 25 29  GLUCOSE 148* 119*  BUN 10 12  CREATININE 0.68 0.79  CALCIUM 8.0* 8.1*     Studies/Results: Ct Abdomen Pelvis W Contrast  04/24/2014   CLINICAL DATA:  Abdominal pain.  Duodenal perforation.  Post ERCP.  EXAM: CT ABDOMEN AND PELVIS WITH CONTRAST  TECHNIQUE: Multidetector CT imaging of the abdomen and pelvis was performed using the standard protocol following bolus administration of intravenous contrast.  CONTRAST:  OMNIPAQUE IOHEXOL 300 MG/ML  SOLN  COMPARISON:  04/20/2014  FINDINGS: Increase in small bilateral pleural effusions noted with associated compressive atelectasis. Biliary gas is present. There is mild reflux of contrast through the ampulla to the common duct.  There is contained extravasation of oral contrast and gas posterior to the second portion of the duodenum, 2.6 x 1.4 cm, at the site of previously identified perforation. A large amount of retroperitoneal fluid is reidentified tracking to the pelvis along the level of the pelvic sidewalls. Trace pelvic cul-de-sac fluid is also identified. No focal hepatic abnormality. Gallstones are noted with gas/fluid level within the gallbladder as well. Right lower renal pole 0.9 cm cyst image 37. Kidneys are otherwise unremarkable. Adrenal glands, spleen, and pancreas are unremarkable.  The appendix is normal. Posterior uterine body fibroid incidentally noted. Uterus and ovaries are otherwise normal. Bladder is normal. No bowel wall thickening or focal segmental dilatation. A few foci of free retroperitoneal gas are noted along the right psoas muscle. No new acute osseous abnormality.  IMPRESSION: Contained retro duodenal collection of fluid/ contrast/ gas at the site of previously seen perforation.  Interval increase in pleural effusions and retroperitoneal fluid.   Electronically Signed   By: Christiana Pellant M.D.   On: 04/24/2014 14:14    Anti-infectives: Anti-infectives   Start     Dose/Rate Route Frequency Ordered Stop   04/21/14 1400  piperacillin-tazobactam (ZOSYN) IVPB 3.375 g     3.375 g 12.5 mL/hr over 240 Minutes Intravenous 3 times per day 04/21/14 0235     04/21/14 1200  ceFAZolin (ANCEF) IVPB 2 g/50 mL premix     2 g 100 mL/hr over 30 Minutes Intravenous On call to O.R. 04/21/14 1126 04/22/14 0559   04/21/14 0245  piperacillin-tazobactam (  ZOSYN) IVPB 3.375 g     3.375 g 100 mL/hr over 30 Minutes Intravenous  Once 04/21/14 0235 04/21/14 0328   04/21/14 0230  piperacillin-tazobactam (ZOSYN) IVPB 3.375 g  Status:  Discontinued     3.375 g 12.5 mL/hr over 240 Minutes Intravenous 3 times per day 04/21/14 0216 04/21/14 0235   04/19/14 1045  ciprofloxacin (CIPRO) IVPB 400 mg     400 mg 200 mL/hr over  60 Minutes Intravenous  Once 04/19/14 1035 04/19/14 1158      Assessment/Plan: s/p Procedure(s): ENDOSCOPIC RETROGRADE CHOLANGIOPANCREATOGRAPHY (ERCP) Duodenal injury post ERCP. I think there is overall slow improvement. Review of the CT shows less contrast extravasation and it appears to be walling off. She still has a lot of fluid in the right retroperitoneum but no organizing abscess. She is less tender and white count remains normal. Her abdominal distention I believe is a combination of ileus and retroperitoneal edema. We will try Dulcolax suppository. Probably some fluid overload and is getting some Lasix. I would favor continued bowel rest and nonoperative management with TNA.  Discussed with the patient and her son and questions answered.    LOS: 7 days    Apryl Brymer T 04/24/2014

## 2014-04-24 NOTE — Progress Notes (Signed)
ANTIBIOTIC CONSULT NOTE - FOLLOW UP  Pharmacy Consult for Zosyn Indication: Duodenal perforation s/p ERCP  Allergies  Allergen Reactions  . Codeine Nausea Only   Patient Measurements: Height: 5\' 6"  (167.6 cm) Weight: 170 lb (77.111 kg) IBW/kg (Calculated) : 59.3  Vital Signs: Temp: 100.4 F (38 C) (07/17 0857) Temp src: Oral (07/17 0857) BP: 107/59 mmHg (07/17 0420) Pulse Rate: 86 (07/17 0420) Intake/Output from previous day: 07/16 0701 - 07/17 0700 In: 3186.1 [P.O.:480; I.V.:857.8; IV Piggyback:656.7; TPN:1191.7] Out: 2550 [Urine:2550] Intake/Output from this shift:    Labs:  Recent Labs  04/22/14 0428 04/23/14 0525 04/24/14 0540  WBC 8.4 8.9 8.4  HGB 13.1 12.6 11.1*  PLT 175 221 205  CREATININE 0.93 0.68 0.79   Estimated Creatinine Clearance: 81.3 ml/min (by C-G formula based on Cr of 0.79). No results found for this basename: VANCOTROUGH, Leodis Binet, VANCORANDOM, GENTTROUGH, GENTPEAK, GENTRANDOM, TOBRATROUGH, TOBRAPEAK, TOBRARND, AMIKACINPEAK, AMIKACINTROU, AMIKACIN,  in the last 72 hours   Microbiology: Recent Results (from the past 720 hour(s))  CSF CULTURE     Status: None   Collection Time    04/16/14  9:35 AM      Result Value Ref Range Status   Gram Stain No WBC Seen   Final   Gram Stain No Organisms Seen   Final   Organism ID, Bacteria NO GROWTH 3 DAYS   Final  SURGICAL PCR SCREEN     Status: None   Collection Time    04/20/14  2:47 AM      Result Value Ref Range Status   MRSA, PCR NEGATIVE  NEGATIVE Final   Staphylococcus aureus NEGATIVE  NEGATIVE Final   Comment:            The Xpert SA Assay (FDA     approved for NASAL specimens     in patients over 19 years of age),     is one component of     a comprehensive surveillance     program.  Test performance has     been validated by [employer] for patients greater     than or equal to 61 year old.     It is not intended     to diagnose infection nor to     guide or monitor treatment.     Anti-infectives   Start     Dose/Rate Route Frequency Ordered Stop   04/21/14 1400  piperacillin-tazobactam (ZOSYN) IVPB 3.375 g     3.375 g 12.5 mL/hr over 240 Minutes Intravenous 3 times per day 04/21/14 0235     04/21/14 1200  ceFAZolin (ANCEF) IVPB 2 g/50 mL premix     2 g 100 mL/hr over 30 Minutes Intravenous On call to O.R. 04/21/14 1126 04/22/14 0559   04/21/14 0245  piperacillin-tazobactam (ZOSYN) IVPB 3.375 g     3.375 g 100 mL/hr over 30 Minutes Intravenous  Once 04/21/14 0235 04/21/14 0328   04/21/14 0230  piperacillin-tazobactam (ZOSYN) IVPB 3.375 g  Status:  Discontinued     3.375 g 12.5 mL/hr over 240 Minutes Intravenous 3 times per day 04/21/14 0216 04/21/14 0235   04/19/14 1045  ciprofloxacin (CIPRO) IVPB 400 mg     400 mg 200 mL/hr over 60 Minutes Intravenous  Once 04/19/14 1035 04/19/14 1158     Assessment: 58yo F unwent ERCP w/ sphincterotomy 04/19/14, developed RUQ and epigastric pain and elevated lipase a few hours later. Pancreatitis was suspected but lipase resolved and a CT  showed perforation of the distal CBP at duodemum. Zosyn begun 7/14.  Day 4 Zosyn, clearance wnl  Goal of Therapy:  Abx dose/schedule appropriate for renal function, infection  Plan:   No change to Zosyn 3.375gm q8hr- 4 hr infusion  Otho BellowsGreen, Leitha Hyppolite L PharmD Pager [phone] 04/24/2014, 9:36 AM

## 2014-04-24 NOTE — Telephone Encounter (Signed)
Called patient and left a message that her LP was unremarkable. No signs of MS

## 2014-04-24 NOTE — Progress Notes (Signed)
PARENTERAL NUTRITION CONSULT NOTE - Follow Up  Pharmacy Consult for TPN Indication: Duodenal perforation post ERCP.  Allergies  Allergen Reactions  . Codeine Nausea Only   Patient Measurements: Height: 5\' 6"  (167.6 cm) Weight: 170 lb (77.111 kg) IBW/kg (Calculated) : 59.3 Adjusted Body Weight: 66kg  Vital Signs: Temp: 98.6 F (37 C) (07/17 0420) Temp src: Oral (07/17 0420) BP: 107/59 mmHg (07/17 0420) Pulse Rate: 86 (07/17 0420) Intake/Output from previous day: 07/16 0701 - 07/17 0700 In: 3186.1 [P.O.:480; I.V.:857.8; IV Piggyback:656.7; TPN:1191.7] Out: 2550 [Urine:2550] Intake/Output from this shift:    Labs:  Recent Labs  04/22/14 0428 04/23/14 0525 04/24/14 0540  WBC 8.4 8.9 8.4  HGB 13.1 12.6 11.1*  HCT 38.1 36.6 31.3*  PLT 175 221 205    Recent Labs  04/22/14 0428 04/23/14 0525 04/24/14 0540  NA 139 139 137  K 4.3 3.9 3.4*  CL 105 105 99  CO2 23 25 29   GLUCOSE 131* 148* 119*  BUN 12 10 12   CREATININE 0.93 0.68 0.79  CALCIUM 7.9* 8.0* 8.1*  MG 1.6 2.3  --   PHOS  --  1.3* 2.2*  PROT 5.3* 5.5* 5.5*  ALBUMIN 2.0* 2.1* 2.0*  AST 21 17 17   ALT 52* 37* 27  ALKPHOS 53 49 61  BILITOT 0.8 0.7 0.7  PREALBUMIN  --  6.0*  --   TRIG  --  104  --    Estimated Creatinine Clearance: 81.3 ml/min (by C-G formula based on Cr of 0.79).    Recent Labs  04/24/14 0001 04/24/14 0415 04/24/14 0801  GLUCAP 140* 116* 110*   Medications:  Scheduled:  . insulin aspart  0-15 Units Subcutaneous 6 times per day  . morphine   Intravenous 6 times per day  . pantoprazole (PROTONIX) IV  40 mg Intravenous Q12H  . piperacillin-tazobactam (ZOSYN)  IV  3.375 g Intravenous 3 times per day  . potassium chloride  10 mEq Intravenous Q1 Hr x 3  . potassium phosphate IVPB (mmol)  20 mmol Intravenous Once   Infusions:  . sodium chloride 10 mL/hr at 04/23/14 1430  . Marland Kitchen.TPN (CLINIMIX-E) Adult 40 mL/hr at 04/23/14 1754   And  . fat emulsion 250 mL (04/23/14 1754)   Insulin  Requirements in the past 24 hours:  6 units for CBG 125,138/140,116,110, serum glucose 119 this am  Current Nutrition: NPO Clinimix E 5/15 at 40 ml/hr Lipids 20% at 10 ml/hr  [facility]  Assessment: 58yo F unwent ERCP w/ sphincterotomy 04/19/14, developed RUQ and epigastric pain and elevated lipase a few hours later. Pancreatitis was suspected but lipase resolved and a CT showed perforation of the distal CBP at duodemum. Patient has remained non-toxic appearing so treating medically for now. Pharmacy is asked to start TPN to allow bowel rest. She was eating normally prior to this admission.   Glucose: 154-124.  Electrolytes: K 3.4, Phosphorus 1.3-> 2.2 with 20 mmol K Phos bolus, Corr Ca 9.3  LFTs:- improved to wnl. Lipase wnl.  TGs: 104  Prealbumin: 6.0  Potassium Phospate bolus 20 mmol x1 today  Concern for fluid retention, IV rate decreased and Lasix yesterday with good diuresis  Nutritional Goals:  RD recs: Kcal 1800-2000, Protein 90-105 gm, Fluid requirement >/= 1800 ml all per 24 hr Clinimix E 5/15 at a goal rate of 3983ml/hr + 20% fat emulsion at 3410ml/hr to provide: 100g/day protein, 1912Kcal/day.  TPN Access: PICC placed  TPN day#: 3  Plan: At  1800 today:  Increase Clinimix E 5/15 to 34ml/hr.  20% fat emulsion at 45ml/hr.  Plan to advance as tolerated to the goal rate   TNA to contain standard multivitamins and trace elements daily  NS at 10 m/hr  Novolog SSI moderate q4h, likely decrease CBG to  q6h when closer to goal range  TNA lab panels on Mondays & Thursdays.  F/u daily: BMET and Phosporus level in am  Otho Bellows PharmD Pager [phone] 04/24/2014, 8:55 AM

## 2014-04-24 NOTE — Progress Notes (Signed)
Pt. Called RN into room pt. Stated that she felt as if she has a temperature.  Pt. Temp was checked orally and was 100.4. Pt. Was given tylenol suppository per her request and MD was notified will continue to monitor.

## 2014-04-24 NOTE — Progress Notes (Signed)
[facility] PROGRESS NOTE  Krista Chapman EAV:[id number] DOB: [birth year] DOA: 04/17/2014 PCP: Neldon Labella, MD  Assessment/Plan: #1 post ERCP duodenal perforation with retroperitoneal perforation Patient currently afebrile. No peritoneal signs. LFTs trending down.  White count of 8.4. Continue empiric IV Zosyn, bowel rest, IV fluids, pain management. GI and general surgery following and appreciate input and recommendations. Records have been sent to [facility] for review per patient request and GI managing it. Repeat CT abd and pelvis pending.   #2 choledocholithiasis/acute pancreatitis Status post ERCP with stone removal. Patient with acute pancreatitis status post ERCP. Lipase levels trending down. Continue bowel rest, IV fluids, pain management, supportive care. GI  And general surgery following.  #3 acute renal failure Likely secondary to prerenal azotemia. Improving with hydration. Follow.  #4 fever Likely secondary to problem #1. Will check blood cultures x2. Continue empiric IV Zosyn. Repeat CT abdomen and pelvis pending.  #5 prophylaxis SCDs for DVT prophylaxis.  Code Status: Full Family Communication: Updated patient at bedside. Disposition Plan: Home when medically stable.   Consultants:  GI: Dr Dulce Sellar 04/18/14  CCS: Dr Johna Sheriff 04/18/14  Procedures:  ERCP with sphincterectomy/papillotomy and ERCP with removal of calculus/calculi 04/19/2014 per Dr. Dulce Sellar  CT abdomen and pelvis 04/20/2014  Abdominal ultrasound 04/17/2014  Antibiotics:  IV Zosyn 04/21/14  HPI/Subjective: Patient c/o diffuse abdominal pain, and not feeling too well today.   Objective: Filed Vitals:   04/24/14 0857  BP:   Pulse:   Temp: 100.4 F (38 C)  Resp:     Intake/Output Summary (Last 24 hours) at 04/24/14 1024 Last data filed at 04/24/14 0540  Gross per 24 hour  Intake 2559.47 ml  Output   2250 ml  Net 309.47 ml   Filed Weights   04/17/14 1835  Weight: 77.111 kg (170 lb)     Exam:   General:  NAD  Cardiovascular: RRR  Respiratory: CTAB  Abdomen: Soft/diffuse TTP RUQ and lower abdomen. /ND/+BS  Musculoskeletal: No c/c/e  Data Reviewed: Basic Metabolic Panel:  Recent Labs Lab 04/17/14 1457 04/18/14 0403  04/20/14 1905 04/21/14 0413 04/22/14 0428 04/23/14 0525 04/24/14 0540  NA 137 141  < > 139 137 139 139 137  K 4.1 4.5  < > 3.9 3.8 4.3 3.9 3.4*  CL 95* 105  < > 102 101 105 105 99  CO2 27 24  < > 25 22 23 25 29   GLUCOSE 141* 88  < > 98 104* 131* 148* 119*  BUN 14 11  < > 7 9 12 10 12   CREATININE 1.26* 1.12*  < > 1.03 0.94 0.93 0.68 0.79  CALCIUM 9.3 9.0  < > 8.0* 7.5* 7.9* 8.0* 8.1*  MG  --  2.0  --   --   --  1.6 2.3  --   PHOS  --   --   --   --   --   --  1.3* 2.2*  < > = values in this interval not displayed. Liver Function Tests:  Recent Labs Lab 04/20/14 1905 04/21/14 0413 04/22/14 0428 04/23/14 0525 04/24/14 0540  AST 36 31 21 17 17   ALT 109* 85* 52* 37* 27  ALKPHOS 76 71 53 49 61  BILITOT 1.1 1.4* 0.8 0.7 0.7  PROT 5.9* 5.4* 5.3* 5.5* 5.5*  ALBUMIN 2.9* 2.5* 2.0* 2.1* 2.0*    Recent Labs Lab 04/20/14 0407 04/20/14 1905 04/21/14 0413 04/22/14 0428 04/24/14 0540  LIPASE 206* 66* 50 32 68*   No results found  for this basename: AMMONIA,  in the last 168 hours CBC:  Recent Labs Lab 04/17/14 1457  04/20/14 1905 04/21/14 0413 04/21/14 1330 04/22/14 0428 04/23/14 0525 04/24/14 0540  WBC 9.9  < > 9.8 11.2*  --  8.4 8.9 8.4  NEUTROABS 8.1*  --   --   --  9.5* 7.0 7.1  --   HGB 14.1  < > 14.5 14.0  --  13.1 12.6 11.1*  HCT 40.3  < > 42.3 41.8  --  38.1 36.6 31.3*  MCV 90.2  < > 92.0 91.7  --  91.1 90.8 90.5  PLT 196  < > 155 168  --  175 221 205  < > = values in this interval not displayed. Cardiac Enzymes: No results found for this basename: CKTOTAL, CKMB, CKMBINDEX, TROPONINI,  in the last 168 hours BNP (last 3 results) No results found for this basename: PROBNP,  in the last 8760  hours CBG:  Recent Labs Lab 04/23/14 1709 04/23/14 2031 04/24/14 0001 04/24/14 0415 04/24/14 0801  GLUCAP 125* 138* 140* 116* 110*    Recent Results (from the past 240 hour(s))  CSF CULTURE     Status: None   Collection Time    04/16/14  9:35 AM      Result Value Ref Range Status   Gram Stain No WBC Seen   Final   Gram Stain No Organisms Seen   Final   Organism ID, Bacteria NO GROWTH 3 DAYS   Final  SURGICAL PCR SCREEN     Status: None   Collection Time    04/20/14  2:47 AM      Result Value Ref Range Status   MRSA, PCR NEGATIVE  NEGATIVE Final   Staphylococcus aureus NEGATIVE  NEGATIVE Final   Comment:            The Xpert SA Assay (FDA     approved for NASAL specimens     in patients over 57 years of age),     is one component of     a comprehensive surveillance     program.  Test performance has     been validated by [facility] for patients greater     than or equal to 57 year old.     It is not intended     to diagnose infection nor to     guide or monitor treatment.     Studies: No results found.  Scheduled Meds: . insulin aspart  0-15 Units Subcutaneous 6 times per day  . morphine   Intravenous 6 times per day  . pantoprazole (PROTONIX) IV  40 mg Intravenous Q12H  . piperacillin-tazobactam (ZOSYN)  IV  3.375 g Intravenous 3 times per day  . potassium chloride  10 mEq Intravenous Q1 Hr x 3  . potassium phosphate IVPB (mmol)  20 mmol Intravenous Once   Continuous Infusions: . sodium chloride 10 mL/hr at 04/23/14 1430  . Marland Kitchen.TPN (CLINIMIX-E) Adult 40 mL/hr at 04/23/14 1754   And  . fat emulsion 250 mL (04/23/14 1754)  . Marland Kitchen.TPN (CLINIMIX-E) Adult     And  . fat emulsion      Principal Problem:   Retroperitoneal air: Post ERCP retroperitoneal perforation Active Problems:   Common bile duct stone   Choledocholithiasis   ARF (acute renal failure)    Time spent: 35 MINS    [facility]ic[facility] LLCHOMPSON,Vandella Ord MD [facility] Pager [phone]. If 7PM-7AM,  please contact night-coverage at  [email or web address], password [facility] 04/24/2014, 10:24 AM  LOS: 7 days

## 2014-04-24 NOTE — Progress Notes (Signed)
Was feeling better until later this morning.  Was having some flushing and had Tm 100.4.  Is able to ambulate the room and sit in chair.  Patient reports that pain is worse today.  Is having some back pain.  Does not look toxic on exam.  Continuing IV antibiotics and TPN.  Surgery following along with [place].

## 2014-04-25 LAB — BASIC METABOLIC PANEL
ANION GAP: 10 (ref 5–15)
ANION GAP: 9 (ref 5–15)
BUN: 10 mg/dL (ref 6–23)
BUN: 10 mg/dL (ref 6–23)
CALCIUM: 7.9 mg/dL — AB (ref 8.4–10.5)
CALCIUM: 8.1 mg/dL — AB (ref 8.4–10.5)
CO2: 31 mEq/L (ref 19–32)
CO2: 30 mEq/L (ref 19–32)
Chloride: 95 mEq/L — ABNORMAL LOW (ref 96–112)
Chloride: 97 mEq/L (ref 96–112)
Creatinine, Ser: 0.79 mg/dL (ref 0.50–1.10)
Creatinine, Ser: 0.71 mg/dL (ref 0.50–1.10)
GFR calc non Af Amer: >90 mL/min (ref >90)
GFR calc non Af Amer: >90 mL/min (ref >90)
GLUCOSE: 127 mg/dL — AB (ref 70–99)
Glucose, Bld: 124 mg/dL — ABNORMAL HIGH (ref 70–99)
POTASSIUM: 3.5 meq/L — AB (ref 3.7–5.3)
Potassium: 3 mEq/L — ABNORMAL LOW (ref 3.7–5.3)
SODIUM: 136 meq/L — AB (ref 137–147)
Sodium: 136 mEq/L — ABNORMAL LOW (ref 137–147)

## 2014-04-25 LAB — GLUCOSE, CAPILLARY
GLUCOSE-CAPILLARY: 114 mg/dL — AB (ref 70–99)
GLUCOSE-CAPILLARY: 114 mg/dL — AB (ref 70–99)
GLUCOSE-CAPILLARY: 146 mg/dL — AB (ref 70–99)
Glucose-Capillary: 126 mg/dL — ABNORMAL HIGH (ref 70–99)
Glucose-Capillary: 127 mg/dL — ABNORMAL HIGH (ref 70–99)
Glucose-Capillary: 131 mg/dL — ABNORMAL HIGH (ref 70–99)
Glucose-Capillary: 133 mg/dL — ABNORMAL HIGH (ref 70–99)

## 2014-04-25 LAB — CBC
HCT: 30 % — ABNORMAL LOW (ref 36.0–46.0)
HEMOGLOBIN: 10.4 g/dL — AB (ref 12.0–15.0)
MCH: 31.2 pg (ref 26.0–34.0)
MCHC: 34.7 g/dL (ref 30.0–36.0)
MCV: 90.1 fL (ref 78.0–100.0)
Platelets: 194 10*3/uL (ref 150–400)
RBC: 3.33 MIL/uL — ABNORMAL LOW (ref 3.87–5.11)
RDW: 13.2 % (ref 11.5–15.5)
WBC: 7.7 10*3/uL (ref 4.0–10.5)

## 2014-04-25 LAB — PHOSPHORUS: Phosphorus: 3.3 mg/dL (ref 2.3–4.6)

## 2014-04-25 LAB — MAGNESIUM: Magnesium: 1.8 mg/dL (ref 1.5–2.5)

## 2014-04-25 MED ORDER — BIOTENE DRY MOUTH MT LIQD
15.0000 mL | Freq: Two times a day (BID) | OROMUCOSAL | Status: DC
  Administered 2014-04-26 – 2014-04-27 (×4): 15 mL via OROMUCOSAL

## 2014-04-25 MED ORDER — CHLORHEXIDINE GLUCONATE 0.12 % MT SOLN
15.0000 mL | Freq: Two times a day (BID) | OROMUCOSAL | Status: DC
  Administered 2014-04-26 – 2014-04-27 (×2): 15 mL via OROMUCOSAL
  Filled 2014-04-25 (×5): qty 15

## 2014-04-25 MED ORDER — FUROSEMIDE 10 MG/ML IJ SOLN
40.0000 mg | Freq: Once | INTRAMUSCULAR | Status: AC
  Administered 2014-04-25: 40 mg via INTRAVENOUS
  Filled 2014-04-25: qty 4

## 2014-04-25 MED ORDER — TRACE MINERALS CR-CU-F-FE-I-MN-MO-SE-ZN IV SOLN
INTRAVENOUS | Status: AC
  Administered 2014-04-25: 18:00:00 via INTRAVENOUS
  Filled 2014-04-25: qty 2000

## 2014-04-25 MED ORDER — POTASSIUM CHLORIDE 10 MEQ/100ML IV SOLN
10.0000 meq | INTRAVENOUS | Status: AC
  Administered 2014-04-25 (×5): 10 meq via INTRAVENOUS
  Filled 2014-04-25 (×5): qty 100

## 2014-04-25 MED ORDER — HYDROCORTISONE 1 % EX CREA
TOPICAL_CREAM | CUTANEOUS | Status: DC | PRN
  Administered 2014-04-25: 14:00:00 via TOPICAL
  Filled 2014-04-25: qty 28

## 2014-04-25 MED ORDER — FAT EMULSION 20 % IV EMUL
250.0000 mL | INTRAVENOUS | Status: AC
  Administered 2014-04-25: 250 mL via INTRAVENOUS
  Filled 2014-04-25: qty 250

## 2014-04-25 NOTE — Progress Notes (Signed)
6 Days Post-Op  Subjective: She seems to feel a little better today. No bm yet. No nausea  Objective: Vital signs in last 24 hours: Temp:  [97.6 F (36.4 C)-100.4 F (38 C)] 98.8 F (37.1 C) (07/18 0200) Pulse Rate:  [90-107] 97 (07/18 0556) Resp:  [15-25] 17 (07/18 0800) BP: (117-134)/(70-77) 124/70 mmHg (07/18 0556) SpO2:  [91 %-98 %] 97 % (07/18 0800) FiO2 (%):  [28 %] 28 % (07/18 0800) Last BM Date: 04/17/14  Intake/Output from previous day: 07/17 0701 - 07/18 0700 In: 970 [TPN:970] Out: 1875 [Urine:1875] Intake/Output this shift:    Resp: clear to auscultation bilaterally Cardio: regular rate and rhythm GI: soft, moderate central tenderness. good bs  Lab Results:   Recent Labs  04/24/14 0540 04/25/14 0450  WBC 8.4 7.7  HGB 11.1* 10.4*  HCT 31.3* 30.0*  PLT 205 194   BMET  Recent Labs  04/24/14 0540 04/25/14 0450  NA 137 136*  K 3.4* 3.0*  CL 99 95*  CO2 29 31  GLUCOSE 119* 124*  BUN 12 10  CREATININE 0.79 0.79  CALCIUM 8.1* 7.9*   PT/INR No results found for this basename: LABPROT, INR,  in the last 72 hours ABG No results found for this basename: PHART, PCO2, PO2, HCO3,  in the last 72 hours  Studies/Results: Ct Abdomen Pelvis W Contrast  04/24/2014   CLINICAL DATA:  Abdominal pain.  Duodenal perforation.  Post ERCP.  EXAM: CT ABDOMEN AND PELVIS WITH CONTRAST  TECHNIQUE: Multidetector CT imaging of the abdomen and pelvis was performed using the standard protocol following bolus administration of intravenous contrast.  CONTRAST:  100mL OMNIPAQUE IOHEXOL 300 MG/ML  SOLN  COMPARISON:  04/20/2014  FINDINGS: Increase in small bilateral pleural effusions noted with associated compressive atelectasis. Biliary gas is present. There is mild reflux of contrast through the ampulla to the common duct. There is contained extravasation of oral contrast and gas posterior to the second portion of the duodenum, 2.6 x 1.4 cm, at the site of previously identified  perforation. A large amount of retroperitoneal fluid is reidentified tracking to the pelvis along the level of the pelvic sidewalls. Trace pelvic cul-de-sac fluid is also identified. No focal hepatic abnormality. Gallstones are noted with gas/fluid level within the gallbladder as well. Right lower renal pole 0.9 cm cyst image 37. Kidneys are otherwise unremarkable. Adrenal glands, spleen, and pancreas are unremarkable.  The appendix is normal. Posterior uterine body fibroid incidentally noted. Uterus and ovaries are otherwise normal. Bladder is normal. No bowel wall thickening or focal segmental dilatation. A few foci of free retroperitoneal gas are noted along the right psoas muscle. No new acute osseous abnormality.  IMPRESSION: Contained retro duodenal collection of fluid/ contrast/ gas at the site of previously seen perforation.  Interval increase in pleural effusions and retroperitoneal fluid.   Electronically Signed   By: Christiana PellantGretchen  Green M.D.   On: 04/24/2014 14:14    Anti-infectives: Anti-infectives   Start     Dose/Rate Route Frequency Ordered Stop   04/21/14 1400  piperacillin-tazobactam (ZOSYN) IVPB 3.375 g     3.375 g 12.5 mL/hr over 240 Minutes Intravenous 3 times per day 04/21/14 0235     04/21/14 1200  ceFAZolin (ANCEF) IVPB 2 g/50 mL premix     2 g 100 mL/hr over 30 Minutes Intravenous On call to O.R. 04/21/14 1126 04/22/14 0559   04/21/14 0245  piperacillin-tazobactam (ZOSYN) IVPB 3.375 g     3.375 g 100 mL/hr over 30  Minutes Intravenous  Once 04/21/14 0235 04/21/14 0328   04/21/14 0230  piperacillin-tazobactam (ZOSYN) IVPB 3.375 g  Status:  Discontinued     3.375 g 12.5 mL/hr over 240 Minutes Intravenous 3 times per day 04/21/14 0216 04/21/14 0235   04/19/14 1045  ciprofloxacin (CIPRO) IVPB 400 mg     400 mg 200 mL/hr over 60 Minutes Intravenous  Once 04/19/14 1035 04/19/14 1158      Assessment/Plan: Chapman/p Procedure(Chapman): ENDOSCOPIC RETROGRADE CHOLANGIOPANCREATOGRAPHY (ERCP)  (N/A) Continue bowel rest Continue IV Zosyn Continue tpn for nutrition support Gallstones will need to be addressed once perforation has healed  LOS: 8 days    Krista Chapman,Krista Chapman 04/25/2014

## 2014-04-25 NOTE — Progress Notes (Signed)
[facility] PROGRESS NOTE  Krista TRUSSEL QPY:[id number] DOB: 1956-02-15 DOA: 04/17/2014 PCP: Neldon Labella, MD  Assessment/Plan: #1 post ERCP duodenal perforation with retroperitoneal perforation Patient currently afebrile. No peritoneal signs. LFTs trending down.  White count of 8.4. Continue empiric IV Zosyn, bowel rest, IV fluids, pain management. GI and general surgery following and appreciate input and recommendations. Records have been sent to [facility] for review per patient request and GI managing it. Repeat CT abd and pelvis with a left extravasation or perforation seems to be walling off. Clinical improvement.   #2 choledocholithiasis/acute pancreatitis Status post ERCP with stone removal. Patient with acute pancreatitis status post ERCP. Lipase levels trending down. Continue bowel rest, IV fluids, pain management, supportive care. GI  And general surgery following.  #3 acute renal failure Likely secondary to prerenal azotemia. Resolved with hydration. Follow.  #4 fever Likely secondary to problem #1. Blood cultures x2 pending. Continue empiric IV Zosyn. Repeat CT abdomen and pelvis with less contrast extravasation and perforation appears to be walling off .  #5 prophylaxis SCDs for DVT prophylaxis.  Code Status: Full Family Communication: Updated patient at bedside. Disposition Plan: Home when medically stable.   Consultants:  GI: Dr Dulce Sellar 04/18/14  CCS: Dr Johna Sheriff 04/18/14  Procedures:  ERCP with sphincterectomy/papillotomy and ERCP with removal of calculus/calculi 04/19/2014 per Dr. Dulce Sellar  CT abdomen and pelvis 04/20/2014, 04/24/2014  Abdominal ultrasound 04/17/2014  Antibiotics:  IV Zosyn 04/21/14  HPI/Subjective: Patient states less diffuse abdominal pain. No bowel movement  Objective: Filed Vitals:   04/25/14 0800  BP:   Pulse:   Temp:   Resp: 17    Intake/Output Summary (Last 24 hours) at 04/25/14 0912 Last data filed at 04/25/14 0600  Gross  per 24 hour  Intake    970 ml  Output   1875 ml  Net   -905 ml   Filed Weights   04/17/14 1835  Weight: 77.111 kg (170 lb)    Exam:   General:  NAD  Cardiovascular: RRR  Respiratory: CTAB  Abdomen: Soft/less TTP RUQ and lower abdomen. /ND/+BS  Musculoskeletal: No c/c/e  Data Reviewed: Basic Metabolic Panel:  Recent Labs Lab 04/21/14 0413 04/22/14 0428 04/23/14 0525 04/24/14 0540 04/25/14 0450 04/25/14 0452  NA 137 139 139 137 136*  --   K 3.8 4.3 3.9 3.4* 3.0*  --   CL 101 105 105 99 95*  --   CO2 22 23 25 29 31   --   GLUCOSE 104* 131* 148* 119* 124*  --   BUN 9 12 10 12 10   --   CREATININE 0.94 0.93 0.68 0.79 0.79  --   CALCIUM 7.5* 7.9* 8.0* 8.1* 7.9*  --   MG  --  1.6 2.3  --   --  1.8  PHOS  --   --  1.3* 2.2* 3.3  --    Liver Function Tests:  Recent Labs Lab 04/20/14 1905 04/21/14 0413 04/22/14 0428 04/23/14 0525 04/24/14 0540  AST 36 31 21 17 17   ALT 109* 85* 52* 37* 27  ALKPHOS 76 71 53 49 61  BILITOT 1.1 1.4* 0.8 0.7 0.7  PROT 5.9* 5.4* 5.3* 5.5* 5.5*  ALBUMIN 2.9* 2.5* 2.0* 2.1* 2.0*    Recent Labs Lab 04/20/14 0407 04/20/14 1905 04/21/14 0413 04/22/14 0428 04/24/14 0540  LIPASE 206* 66* 50 32 68*   No results found for this basename: AMMONIA,  in the last 168 hours CBC:  Recent Labs Lab 04/21/14 0413 04/21/14 1330  04/22/14 0428 04/23/14 0525 04/24/14 0540 04/25/14 0450  WBC 11.2*  --  8.4 8.9 8.4 7.7  NEUTROABS  --  9.5* 7.0 7.1  --   --   HGB 14.0  --  13.1 12.6 11.1* 10.4*  HCT 41.8  --  38.1 36.6 31.3* 30.0*  MCV 91.7  --  91.1 90.8 90.5 90.1  PLT 168  --  175 221 205 194   Cardiac Enzymes: No results found for this basename: CKTOTAL, CKMB, CKMBINDEX, TROPONINI,  in the last 168 hours BNP (last 3 results) No results found for this basename: PROBNP,  in the last 8760 hours CBG:  Recent Labs Lab 04/24/14 1619 04/24/14 2005 04/25/14 0027 04/25/14 0355 04/25/14 0752  GLUCAP 106* 116* 127* 133* 114*     Recent Results (from the past 240 hour(s))  CSF CULTURE     Status: None   Collection Time    04/16/14  9:35 AM      Result Value Ref Range Status   Gram Stain No WBC Seen   Final   Gram Stain No Organisms Seen   Final   Organism ID, Bacteria NO GROWTH 3 DAYS   Final  SURGICAL PCR SCREEN     Status: None   Collection Time    04/20/14  2:47 AM      Result Value Ref Range Status   MRSA, PCR NEGATIVE  NEGATIVE Final   Staphylococcus aureus NEGATIVE  NEGATIVE Final   Comment:            The Xpert SA Assay (FDA     approved for NASAL specimens     in patients over 21 years of age),     is one component of     a comprehensive surveillance     program.  Test performance has     been validated by [facility] for patients greater     than or equal to 1 year old.     It is not intended     to diagnose infection nor to     guide or monitor treatment.     Studies: Ct Abdomen Pelvis W Contrast  04/24/2014   CLINICAL DATA:  Abdominal pain.  Duodenal perforation.  Post ERCP.  EXAM: CT ABDOMEN AND PELVIS WITH CONTRAST  TECHNIQUE: Multidetector CT imaging of the abdomen and pelvis was performed using the standard protocol following bolus administration of intravenous contrast.  CONTRAST:  <MEASUREMEWillow Spr52mRenata CaprPinnacle HospSindyCaterinArtisJeBucks County Gi Endoscopic Surgical Center LLCLifeways<MEASUREMENTWau56mRenata CaprFayetteville Gastroenterology Endoscopy CenterSindCaterinArtisJeThe Gables Surgical CSequoi<MEASUREMENTWal16mRenata CaprDha EndoscopySindyCliffCaterinArtisJeEl Paso DayGracMonadnock Community BS<MEASUREMENTBurlin36mRenata CaprVa Medical Center - ProvidCaterinArtisJeSt. Marys Hospital Ambulatory SurgeEmory Dunwoody MedicaBuMurdock <MEASUREMENTSindyBoulCateJeLakeview Specialty HospMary Washington HosSalGames develoBuKaiser Fo<MEASUREMENTSmiths F68mRenata CaprProvidence Surgery And Procedure CeSindyPaCaterinArtisJeMarlboro Park HospitalBaylor Medical Center At WaBuGreater<MEASUREMENTTierra Gr46mRenata CaprAccess Hospital Dayton,SinCaterinArtisJeMountainview HospitalGraciela HusbYates JamaHealth anARancho M<MEASUREMENTPascag33mRenata CaprSanpete Valley HospCaterinArtisJeKindred Hospital - ChiTri Valley Healt<MEASUREMENTCe75mRenata CaprSt. David'S Medical CeSinCaterinArtisJeCommunity Health Network Rehabilitation SouthGraciela HusbYaBarstow Community <MEASUREMENTWinche65mRenata CaprManatee Memorial HospSindySCaterinArtisJeDanville State HospitalGTexas Health Specialty Hospital Fo<MEASUREMENTFa92mRenata CaprMinnesota Valley Surgery CeSindyFountCaterinArtisJeHealthcare Enterprises LLC Dba The SurgPrecision Surgical Center Of Northwest ArkaBuSpecia<MEASUREMENTCactus Fo66mRenata CaprLake Pines HospSiCaterinArtisJeCallahan Eye HospiKindred Hospital - AlbBuMont<MEASUREMENTGrove32mRenata CaprLifecare Behavioral Health HospSindyCaterinArtisJeOcean Medical CenterGraciela HusbYaSutter Auburn Faith BuPhs Indian Hos<MEASUREMENTWest P28mRenata CaprCassia Regional Medical CeSiCaterinArtisJeRenaissance Surgery Center Of ChatGeorge Washington University BuMe<MEASUREMENTUnion 12mRenata CaprLourdes HospSiCaterinArtisJeLake Cumberland Regional HospitalGraciela HusbYatesRegency Hospital Of HatBuChristus St Vi<MEASUREMENTGr56mRenata CaprBaylor Specialty HospSindCaterinArtisJeOrange City Municipal HospitalGKindred Hospital-CentrBuDignity H<MEASUREMENTWinslow 45mRenata CaprEncompass Health Rehabilitation Hospital Of Rock SiCaterinArtisJeAffinity Medical Pauls Valley General BuSa<MEASUREMENTAliqu27mRenata CaprLandmark Hospital Of Cape GirarSindyTCaterinArtisJeTomah Memorial HoCary Med<MEASUREMENTEast Qu54mRenata CaprMemorial Hospital Of William And Gertrude Jones HospSindyDeCaterinArtisJeMt Ogden Utah Surgical CentNorthern Cochise Community Hospit<MEASUREMENTGlen Ca40mRenata CaprVa Caribbean Healthcare SySindyMilliCaterinArtisJeChildren'S Hospital CoColumbus Endoscopy CeBu<MEASUREMENTWeath35mRenata CaprMemorialcare Saddleback Medical CeSindCaterinArtisJeJack C. Montgomery Va Medical CenterGWeatherford Region<MEASUREMENTU56mRenata CaprArizona State Forensic HospSindCaterinArtisJeAdvanced Surgery Center Of Central IowaBanner Estrella <MEASUREMENTCor63mRenata CaprCentrum Surgery CenterSinCaterinArtisJeEncompass Health Rehabilitation Hospital Of MiamiMemphis Eye And Cataract Ambulatory SurgerBuHig<MEASUREMENTHa3mRenata CaprSwedish Medical Center - Issaquah CaCaterinArtisJeAmbulatory Surgery Center Of Greater New York LLProvidence Hood River Memorial BuHoag Orthopedic Instituteorningrchie PatEnnis RegionaProgramme researcher, broadcas ral pleural effusions noted with associated compressive atelectasis. Biliary gas is present. There is mild reflux of contrast through the ampulla to the common duct. There is contained extravasation of oral contrast and gas posterior to the second portion of the duodenum, 2.6 x 1.4 cm, at the site of previously identified perforation. A large amount of retroperitoneal fluid is reidentified tracking to the pelvis along the level of the pelvic sidewalls. Trace pelvic cul-de-sac fluid is also identified. No focal hepatic abnormality. Gallstones are noted with gas/fluid level within the gallbladder as well. Right  lower renal pole 0.9 cm cyst image 37. Kidneys are otherwise unremarkable. Adrenal glands, spleen, and pancreas are unremarkable.  The appendix is normal. Posterior uterine body fibroid incidentally noted. Uterus and ovaries are otherwise normal. Bladder is normal. No bowel wall thickening or focal segmental dilatation. A few foci of free retroperitoneal gas are noted along the right psoas muscle. No new acute osseous  abnormality.  IMPRESSION: Contained retro duodenal collection of fluid/ contrast/ gas at the site of previously seen perforation.  Interval increase in pleural effusions and retroperitoneal fluid.   Electronically Signed   By: Christiana PellantGretchen  Green M.D.   On: 04/24/2014 14:14    Scheduled Meds: . insulin aspart  0-15 Units Subcutaneous 6 times per day  . morphine   Intravenous 6 times per day  . pantoprazole (PROTONIX) IV  40 mg Intravenous Q12H  . piperacillin-tazobactam (ZOSYN)  IV  3.375 g Intravenous 3 times per day  . potassium chloride  10 mEq Intravenous Q1 Hr x 5   Continuous Infusions: . sodium chloride 10 mL/hr at 04/23/14 1430  . Marland Kitchen.TPN (CLINIMIX-E) Adult 50 mL/hr at 04/24/14 1733   And  . fat emulsion 250 mL (04/24/14 1733)    Principal Problem:   Retroperitoneal air: Post ERCP retroperitoneal perforation Active Problems:   Common bile duct stone   Choledocholithiasis   ARF (acute renal failure)   Fever, unspecified    Time spent: 35 MINS    Valley Regional Medical CenterHOMPSON,DANIEL MD [facility] Pager [phone]. If 7PM-7AM, please contact night-coverage at [email or web address], password [facility] And Health [email or web address] 04/25/2014, 9:12 AM  LOS: 8 days

## 2014-04-25 NOTE — Progress Notes (Signed)
Patient ID: Krista Chapman, female   DOB: 12/08/1955, 58 y.o.   MR[facility]: [id number] [facility]  Krista BishopFaye B Chapman 58 y.o. 09/20/1956   Subjective: Sitting in chair. Reports ambulating in halls today. Has severe abdominal pain and back pain when she stands up. Abdominal pain not as bad with sitting. Denies N/V.  Objective: Vital signs in last 24 hours: Filed Vitals:   04/25/14 1340  BP: 124/70  Pulse: 97  Temp: 99.6 F (37.6 C) (100.3 at 1240)  Resp: 19    Physical Exam: Gen: lethargic, no acute distress Abd: diffuse tenderness (greatest in RLQ) with guarding, soft, nondistended, +BS  Lab Results:  Recent Labs  04/23/14 0525 04/24/14 0540 04/25/14 0450 04/25/14 0452  NA 139 137 136*  --   K 3.9 3.4* 3.0*  --   CL 105 99 95*  --   CO2 25 29 31   --   GLUCOSE 148* 119* 124*  --   BUN 10 12 10   --   CREATININE 0.68 0.79 0.79  --   CALCIUM 8.0* 8.1* 7.9*  --   MG 2.3  --   --  1.8  PHOS 1.3* 2.2* 3.3  --     Recent Labs  04/23/14 0525 04/24/14 0540  AST 17 17  ALT 37* 27  ALKPHOS 49 61  BILITOT 0.7 0.7  PROT 5.5* 5.5*  ALBUMIN 2.1* 2.0*    Recent Labs  04/23/14 0525 04/24/14 0540 04/25/14 0450  WBC 8.9 8.4 7.7  NEUTROABS 7.1  --   --   HGB 12.6 11.1* 10.4*  HCT 36.6 31.3* 30.0*  MCV 90.8 90.5 90.1  PLT 221 205 194   No results found for this basename: LABPROT, INR,  in the last 72 hours    Assessment/Plan: 58 yo s/p retroperitoneal perforation following ERCP with stone extraction who continues to have abdominal pain and spiked a temp today of 100.3. WBC normal. CT yesterday shows contained retroduodenal perforation and interval increase in pleural effusions and retroperitoneal fluid. Continue Zosyn, TPN, bowel rest, IVFs. Appreciate surgery recs and f/u. No new recs. Will follow.   Tawan Corkern C. 04/25/2014, 2:05 PM

## 2014-04-25 NOTE — Progress Notes (Addendum)
PARENTERAL NUTRITION CONSULT NOTE - Follow Up  Pharmacy Consult for TPN Indication: Duodenal perforation post ERCP.  Allergies  Allergen Reactions  . Codeine Nausea Only   Patient Measurements: Height: 5\' 6"  (167.6 cm) Weight: 170 lb (77.111 kg) IBW/kg (Calculated) : 59.3 Adjusted Body Weight: 66kg  Vital Signs: Temp: 98.8 F (37.1 C) (07/18 0200) Temp src: Oral (07/18 0200) BP: 124/70 mmHg (07/18 0556) Pulse Rate: 97 (07/18 0556) Intake/Output from previous day: 07/17 0701 - 07/18 0700 In: 970 [TPN:970] Out: 1875 [Urine:1875] Intake/Output from this shift:    Labs:  Recent Labs  04/23/14 0525 04/24/14 0540 04/25/14 0450  WBC 8.9 8.4 7.7  HGB 12.6 11.1* 10.4*  HCT 36.6 31.3* 30.0*  PLT 221 205 194    Recent Labs  04/23/14 0525 04/24/14 0540 04/25/14 0450 04/25/14 0452  NA 139 137 136*  --   K 3.9 3.4* 3.0*  --   CL 105 99 95*  --   CO2 25 29 31   --   GLUCOSE 148* 119* 124*  --   BUN 10 12 10   --   CREATININE 0.68 0.79 0.79  --   CALCIUM 8.0* 8.1* 7.9*  --   MG 2.3  --   --  1.8  PHOS 1.3* 2.2* 3.3  --   PROT 5.5* 5.5*  --   --   ALBUMIN 2.1* 2.0*  --   --   AST 17 17  --   --   ALT 37* 27  --   --   ALKPHOS 49 61  --   --   BILITOT 0.7 0.7  --   --   PREALBUMIN 6.0*  --   --   --   TRIG 104  --   --   --    Estimated Creatinine Clearance: 81.3 ml/min (by C-G formula based on Cr of 0.79).    Recent Labs  04/25/14 0027 04/25/14 0355 04/25/14 0752  GLUCAP 127* 133* 114*   Medications:  Scheduled:  . insulin aspart  0-15 Units Subcutaneous 6 times per day  . morphine   Intravenous 6 times per day  . pantoprazole (PROTONIX) IV  40 mg Intravenous Q12H  . piperacillin-tazobactam (ZOSYN)  IV  3.375 g Intravenous 3 times per day  . potassium chloride  10 mEq Intravenous Q1 Hr x 5   Infusions:  . sodium chloride 10 mL/hr at 04/23/14 1430  . Marland KitchenTPN (CLINIMIX-E) Adult 50 mL/hr at 04/24/14 1733   And  . fat emulsion 250 mL (04/24/14 1733)    Insulin Requirements in the past 24 hours:  7 units SSI/24h  Current Nutrition:  Diet: NPO Clinimix E 5/15 at 50 ml/hr Lipids 20% at 10 ml/hr  IVF: NS at [facility] (10 ml/hr)  Assessment: 58yo F unwent ERCP w/ sphincterotomy 04/19/14, developed RUQ and epigastric pain and elevated lipase a few hours later. Pancreatitis was suspected but lipase resolved and a CT showed perforation of the distal CBP at duodemum. Patient has remained non-toxic appearing so treating medically for now. Pharmacy is asked to start TPN to allow bowel rest. She was eating normally prior to this admission.   Glucose: Within goal of 150mg /dl  Electrolytes: Na = 829, K = 3 (orders to replace), Phosphorus 3.3 (s/p total 40 mmol K Phos)  , Corr Ca 9.5  Renal: SCr WNL, I/O = 970/1875 (- )  LFTs:- improved to wnl. Lipase wnl.  TGs: 104  Prealbumin: 6.0  Concern for fluid retention, IV rate decreased and  Lasix 7/16 and 7/17 with good diuresis  Nutritional Goals:  RD recs: Kcal 1800-2000, Protein 90-105 gm, Fluid requirement >/= 1800 ml all per 24 hr Clinimix E 5/15 at a goal rate of 4580ml/hr + 20% fat emulsion at 5210ml/hr to provide: 96g/day protein, 1843Kcal/day.  TPN Access: PICC placed  TPN day#: 4  Plan: At 1800 today:  Increase Clinimix E 5/15 to goal 2580ml/hr.  20% fat emulsion at 7310ml/hr.  TNA to contain standard multivitamins and trace elements daily  NS at 10 m/hr  Novolog SSI moderate q4h, likely decrease CBG to  q6h when closer to goal range  TNA lab panels on Mondays & Thursdays.  F/u daily: BM, Mg and Phosporus level in am  Juliette Alcideustin Nahia Nissan, PharmD, BCPS.   Pager: [phone] 04/25/2014, 9:03 AM

## 2014-04-26 LAB — GLUCOSE, CAPILLARY
GLUCOSE-CAPILLARY: 105 mg/dL — AB (ref 70–99)
GLUCOSE-CAPILLARY: 129 mg/dL — AB (ref 70–99)
GLUCOSE-CAPILLARY: 131 mg/dL — AB (ref 70–99)
GLUCOSE-CAPILLARY: 138 mg/dL — AB (ref 70–99)
GLUCOSE-CAPILLARY: 148 mg/dL — AB (ref 70–99)
Glucose-Capillary: 159 mg/dL — ABNORMAL HIGH (ref 70–99)

## 2014-04-26 LAB — BASIC METABOLIC PANEL
Anion gap: 9 (ref 5–15)
BUN: 12 mg/dL (ref 6–23)
CALCIUM: 8.1 mg/dL — AB (ref 8.4–10.5)
CO2: 33 mEq/L — ABNORMAL HIGH (ref 19–32)
Chloride: 95 mEq/L — ABNORMAL LOW (ref 96–112)
Creatinine, Ser: 0.77 mg/dL (ref 0.50–1.10)
GFR calc Af Amer: >90 mL/min (ref >90)
Glucose, Bld: 128 mg/dL — ABNORMAL HIGH (ref 70–99)
Potassium: 3.2 mEq/L — ABNORMAL LOW (ref 3.7–5.3)
Sodium: 137 mEq/L (ref 137–147)

## 2014-04-26 LAB — CBC WITH DIFFERENTIAL/PLATELET
Basophils Absolute: 0.1 10*3/uL (ref 0.0–0.1)
Basophils Relative: 1 % (ref 0–1)
EOS ABS: 0.4 10*3/uL (ref 0.0–0.7)
Eosinophils Relative: 4 % (ref 0–5)
HCT: 32.5 % — ABNORMAL LOW (ref 36.0–46.0)
Hemoglobin: 11.3 g/dL — ABNORMAL LOW (ref 12.0–15.0)
LYMPHS PCT: 11 % — AB (ref 12–46)
Lymphs Abs: 1.1 10*3/uL (ref 0.7–4.0)
MCH: 31.6 pg (ref 26.0–34.0)
MCHC: 34.8 g/dL (ref 30.0–36.0)
MCV: 90.8 fL (ref 78.0–100.0)
Monocytes Absolute: 1 10*3/uL (ref 0.1–1.0)
Monocytes Relative: 10 % (ref 3–12)
NEUTROS ABS: 7.8 10*3/uL — AB (ref 1.7–7.7)
NEUTROS PCT: 74 % (ref 43–77)
PLATELETS: 228 10*3/uL (ref 150–400)
RBC: 3.58 MIL/uL — ABNORMAL LOW (ref 3.87–5.11)
RDW: 13.1 % (ref 11.5–15.5)
WBC: 10.4 10*3/uL (ref 4.0–10.5)

## 2014-04-26 LAB — PHOSPHORUS: Phosphorus: 3.6 mg/dL (ref 2.3–4.6)

## 2014-04-26 LAB — MAGNESIUM: Magnesium: 1.9 mg/dL (ref 1.5–2.5)

## 2014-04-26 MED ORDER — FAT EMULSION 20 % IV EMUL
250.0000 mL | INTRAVENOUS | Status: AC
  Administered 2014-04-26: 250 mL via INTRAVENOUS
  Filled 2014-04-26: qty 250

## 2014-04-26 MED ORDER — POTASSIUM CHLORIDE 10 MEQ/100ML IV SOLN
10.0000 meq | INTRAVENOUS | Status: AC
  Administered 2014-04-26 (×5): 10 meq via INTRAVENOUS
  Filled 2014-04-26 (×5): qty 100

## 2014-04-26 MED ORDER — ACETAMINOPHEN 10 MG/ML IV SOLN
1000.0000 mg | Freq: Once | INTRAVENOUS | Status: AC
  Administered 2014-04-26: 1000 mg via INTRAVENOUS
  Filled 2014-04-26: qty 100

## 2014-04-26 MED ORDER — FUROSEMIDE 10 MG/ML IJ SOLN
40.0000 mg | Freq: Once | INTRAMUSCULAR | Status: AC
  Administered 2014-04-26: 40 mg via INTRAVENOUS
  Filled 2014-04-26: qty 4

## 2014-04-26 MED ORDER — TRACE MINERALS CR-CU-F-FE-I-MN-MO-SE-ZN IV SOLN
INTRAVENOUS | Status: AC
  Administered 2014-04-26: 17:00:00 via INTRAVENOUS
  Filled 2014-04-26: qty 2000

## 2014-04-26 NOTE — Progress Notes (Addendum)
7 Days Post-Op  Subjective: Her level of pain is unchanged. No nausea but she has run low grade fever  Objective: Vital signs in last 24 hours: Temp:  [99 F (37.2 C)-100.3 F (37.9 C)] 100.1 F (37.8 C) (07/19 0653) Pulse Rate:  [95-101] 101 (07/19 0456) Resp:  [12-23] 12 (07/19 0505) BP: (121-130)/(66-71) 130/71 mmHg (07/19 0456) SpO2:  [93 %-100 %] 98 % (07/19 0505) FiO2 (%):  [21 %] 21 % (07/18 1200) Weight:  [180 lb 1.6 oz (81.693 kg)-189 lb 1.6 oz (85.775 kg)] 180 lb 1.6 oz (81.693 kg) (07/19 0456) Last BM Date: 04/17/14  Intake/Output from previous day: 07/18 0701 - 07/19 0700 In: 550 [IV Piggyback:550] Out: 3500 [Urine:3500] Intake/Output this shift:    Resp: clear to auscultation bilaterally Cardio: regular rate and rhythm GI: moderate diffuse tenderness. few bs  Lab Results:   Recent Labs  04/24/14 0540 04/25/14 0450  WBC 8.4 7.7  HGB 11.1* 10.4*  HCT 31.3* 30.0*  PLT 205 194   BMET  Recent Labs  04/25/14 1405 04/26/14 0630  NA 136* 137  K 3.5* 3.2*  CL 97 95*  CO2 30 33*  GLUCOSE 127* 128*  BUN 10 12  CREATININE 0.71 0.77  CALCIUM 8.1* 8.1*   PT/INR No results found for this basename: LABPROT, INR,  in the last 72 hours ABG No results found for this basename: PHART, PCO2, PO2, HCO3,  in the last 72 hours  Studies/Results: Ct Abdomen Pelvis W Contrast  04/24/2014   CLINICAL DATA:  Abdominal pain.  Duodenal perforation.  Post ERCP.  EXAM: CT ABDOMEN AND PELVIS WITH CONTRAST  TECHNIQUE: Multidetector CT imaging of the abdomen and pelvis was performed using the standard protocol following bolus administration of intravenous contrast.  CONTRAST:  OMNIPAQUE IOHEXOL 300 MG/ML  SOLN  COMPARISON:  04/20/2014  FINDINGS: Increase in small bilateral pleural effusions noted with associated compressive atelectasis. Biliary gas is present. There is mild reflux of contrast through the ampulla to the common duct. There is contained extravasation of oral  contrast and gas posterior to the second portion of the duodenum, 2.6 x 1.4 cm, at the site of previously identified perforation. A large amount of retroperitoneal fluid is reidentified tracking to the pelvis along the level of the pelvic sidewalls. Trace pelvic cul-de-sac fluid is also identified. No focal hepatic abnormality. Gallstones are noted with gas/fluid level within the gallbladder as well. Right lower renal pole 0.9 cm cyst image 37. Kidneys are otherwise unremarkable. Adrenal glands, spleen, and pancreas are unremarkable.  The appendix is normal. Posterior uterine body fibroid incidentally noted. Uterus and ovaries are otherwise normal. Bladder is normal. No bowel wall thickening or focal segmental dilatation. A few foci of free retroperitoneal gas are noted along the right psoas muscle. No new acute osseous abnormality.  IMPRESSION: Contained retro duodenal collection of fluid/ contrast/ gas at the site of previously seen perforation.  Interval increase in pleural effusions and retroperitoneal fluid.   Electronically Signed   By: Christiana Pellant M.D.   On: 04/24/2014 14:14    Anti-infectives: Anti-infectives   Start     Dose/Rate Route Frequency Ordered Stop   04/21/14 1400  piperacillin-tazobactam (ZOSYN) IVPB 3.375 g     3.375 g 12.5 mL/hr over 240 Minutes Intravenous 3 times per day 04/21/14 0235     04/21/14 1200  ceFAZolin (ANCEF) IVPB 2 g/50 mL premix     2 g 100 mL/hr over 30 Minutes Intravenous On call to O.R. 04/21/14  1126 04/22/14 0559   04/21/14 0245  piperacillin-tazobactam (ZOSYN) IVPB 3.375 g     3.375 g 100 mL/hr over 30 Minutes Intravenous  Once 04/21/14 0235 04/21/14 0328   04/21/14 0230  piperacillin-tazobactam (ZOSYN) IVPB 3.375 g  Status:  Discontinued     3.375 g 12.5 mL/hr over 240 Minutes Intravenous 3 times per day 04/21/14 0216 04/21/14 0235   04/19/14 1045  ciprofloxacin (CIPRO) IVPB 400 mg     400 mg 200 mL/hr over 60 Minutes Intravenous  Once 04/19/14 1035  04/19/14 1158      Assessment/Plan: s/p Procedure(s): ENDOSCOPIC RETROGRADE CHOLANGIOPANCREATOGRAPHY (ERCP) (N/A) Continue bowel rest Continue tpn for nutrition support Continue IV zosyn for retroperitoneal duodenal perforation Recheck wbc today Will monitor closely  LOS: 9 days    TOTH III,PAUL S 04/26/2014

## 2014-04-26 NOTE — Progress Notes (Signed)
Hospitalist paged and made aware of oral temp of 101.0. Tylenol suppository given to patient per order in chart already.

## 2014-04-26 NOTE — Progress Notes (Signed)
[facility] PROGRESS NOTE  Krista Chapman ZOX:[id number] DOB: 03/05/1956 DOA: 04/17/2014 PCP: Neldon Labella, MD  Assessment/Plan: #1 post ERCP duodenal perforation with retroperitoneal perforation Patient currently afebrile. No peritoneal signs. LFTs trending down.  White count of 8.4. Continue empiric IV Zosyn, bowel rest, IV fluids, pain management. GI and general surgery following and appreciate input and recommendations. [facility] have been sent to [facility] for review per patient request and GI managing it. Repeat CT abd and pelvis with a left extravasation or perforation seems to be walling off. Clinical improvement.   #2 choledocholithiasis/acute pancreatitis Status post ERCP with stone removal. Patient with acute pancreatitis status post ERCP. Lipase levels trending down. Continue bowel rest, IV fluids, pain management, supportive care. GI  And general surgery following.  #3 acute renal failure Likely secondary to prerenal azotemia. Resolved with hydration. Follow.  #4 fever Likely secondary to problem #1. Blood cultures x2 pending. Continue empiric IV Zosyn. Repeat CT abdomen and pelvis with less contrast extravasation and perforation appears to be walling off .  #5 prophylaxis SCDs for DVT prophylaxis.  Code Status: Full Family Communication: Updated patient at bedside. Disposition Plan: Home when medically stable.   Consultants:  GI: Dr Dulce Sellar 04/18/14  CCS: Dr Johna Sheriff 04/18/14  Procedures:  ERCP with sphincterectomy/papillotomy and ERCP with removal of calculus/calculi 04/19/2014 per Dr. Dulce Sellar  CT abdomen and pelvis 04/20/2014, 04/24/2014  Abdominal ultrasound 04/17/2014  Antibiotics:  IV Zosyn 04/21/14  HPI/Subjective: Patient states less diffuse abdominal pain and more in the lower abdomen. No bowel movement. Patient with flatus.  Objective: Filed Vitals:   04/26/14 0800  BP:   Pulse:   Temp:   Resp: 20    Intake/Output Summary (Last 24 hours) at  04/26/14 0954 Last data filed at 04/26/14 0653  Gross per 24 hour  Intake    350 ml  Output   3500 ml  Net  -3150 ml   Filed Weights   04/17/14 1835 04/25/14 1737 04/26/14 0456  Weight: 77.111 kg (170 lb) 85.775 kg (189 lb 1.6 oz) 81.693 kg (180 lb 1.6 oz)    Exam:   General:  NAD  Cardiovascular: RRR  Respiratory: CTAB  Abdomen: Soft/less TTP RUQ and lower abdomen. /ND/+BS  Musculoskeletal: No c/c/e  Data Reviewed: Basic Metabolic Panel:  Recent Labs Lab 04/22/14 0428 04/23/14 0525 04/24/14 0540 04/25/14 0450 04/25/14 0452 04/25/14 1405 04/26/14 0630  NA 139 139 137 136*  --  136* 137  K 4.3 3.9 3.4* 3.0*  --  3.5* 3.2*  CL 105 105 99 95*  --  97 95*  CO2 23 25 29 31   --  30 33*  GLUCOSE 131* 148* 119* 124*  --  127* 128*  BUN 12 10 12 10   --  10 12  CREATININE 0.93 0.68 0.79 0.79  --  0.71 0.77  CALCIUM 7.9* 8.0* 8.1* 7.9*  --  8.1* 8.1*  MG 1.6 2.3  --   --  1.8  --  1.9  PHOS  --  1.3* 2.2* 3.3  --   --  3.6   Liver Function Tests:  Recent Labs Lab 04/20/14 1905 04/21/14 0413 04/22/14 0428 04/23/14 0525 04/24/14 0540  AST 36 31 21 17 17   ALT 109* 85* 52* 37* 27  ALKPHOS 76 71 53 49 61  BILITOT 1.1 1.4* 0.8 0.7 0.7  PROT 5.9* 5.4* 5.3* 5.5* 5.5*  ALBUMIN 2.9* 2.5* 2.0* 2.1* 2.0*    Recent Labs Lab 04/20/14 0407 04/20/14 1905 04/21/14  0413 04/22/14 0428 04/24/14 0540  LIPASE 206* 66* 50 32 68*   No results found for this basename: AMMONIA,  in the last 168 hours CBC:  Recent Labs Lab 04/21/14 0413 04/21/14 1330 04/22/14 0428 04/23/14 0525 04/24/14 0540 04/25/14 0450  WBC 11.2*  --  8.4 8.9 8.4 7.7  NEUTROABS  --  9.5* 7.0 7.1  --   --   HGB 14.0  --  13.1 12.6 11.1* 10.4*  HCT 41.8  --  38.1 36.6 31.3* 30.0*  MCV 91.7  --  91.1 90.8 90.5 90.1  PLT 168  --  175 221 205 194   Cardiac Enzymes: No results found for this basename: CKTOTAL, CKMB, CKMBINDEX, TROPONINI,  in the last 168 hours BNP (last 3 results) No results  found for this basename: PROBNP,  in the last 8760 hours CBG:  Recent Labs Lab 04/25/14 1554 04/25/14 2001 04/26/14 04/26/14 0429 04/26/14 0732  GLUCAP 126* 131* 146* 138* 129*    Recent Results (from the past 240 hour(s))  SURGICAL PCR SCREEN     Status: None   Collection Time    04/20/14  2:47 AM      Result Value Ref Range Status   MRSA, PCR NEGATIVE  NEGATIVE Final   Staphylococcus aureus NEGATIVE  NEGATIVE Final   Comment:            The Xpert SA Assay (FDA     approved for NASAL specimens     in patients over 57 years of age),     is one component of     a comprehensive surveillance     program.  Test performance has     been validated by [facility] for patients greater     than or equal to 57 year old.     It is not intended     to diagnose infection nor to     guide or monitor treatment.  CULTURE, BLOOD (ROUTINE X 2)     Status: None   Collection Time    04/24/14 11:20 AM      Result Value Ref Range Status   Specimen Description BLOOD LEFT ARM   Final   Special Requests BOTTLES DRAWN AEROBIC AND ANAEROBIC 5CC EACH   Final   Culture  Setup Time     Final   Value: 04/24/2014 13:44     Performed at [facility]   Culture     Final   Value:        BLOOD CULTURE RECEIVED NO GROWTH TO DATE CULTURE WILL BE HELD FOR 5 DAYS BEFORE ISSUING A FINAL NEGATIVE REPORT     Performed at [facility]   Report Status PENDING   Incomplete  CULTURE, BLOOD (ROUTINE X 2)     Status: None   Collection Time    04/24/14 11:25 AM      Result Value Ref Range Status   Specimen Description BLOOD LEFT HAND   Final   Special Requests BOTTLES DRAWN AEROBIC ONLY 4CC   Final   Culture  Setup Time     Final   Value: 04/24/2014 13:44     Performed at [facility]   Culture     Final   Value:        BLOOD CULTURE RECEIVED NO GROWTH TO DATE CULTURE WILL BE HELD FOR 5 DAYS BEFORE ISSUING A FINAL NEGATIVE REPORT     Performed at [facility]  Report  Status PENDING   Incomplete     Studies: Ct Abdomen Pelvis W Contrast  04/24/2014   CLINICAL DATA:  Abdominal pain.  Duodenal perforation.  Post ERCP.  EXAM: CT ABDOMEN AND PELVIS WITH CONTRAST  TECHNIQUE: Multidetector CT imaging of the abdomen and pelvis was performed using the standard protocol following bolus administration of intravenous contrast.  CONTRAST:  OMNIPAQUE IOHEXOL 300 MG/ML  SOLN  COMPARISON:  04/20/2014  FINDINGS: Increase in small bilateral pleural effusions noted with associated compressive atelectasis. Biliary gas is present. There is mild reflux of contrast through the ampulla to the common duct. There is contained extravasation of oral contrast and gas posterior to the second portion of the duodenum, 2.6 x 1.4 cm, at the site of previously identified perforation. A large amount of retroperitoneal fluid is reidentified tracking to the pelvis along the level of the pelvic sidewalls. Trace pelvic cul-de-sac fluid is also identified. No focal hepatic abnormality. Gallstones are noted with gas/fluid level within the gallbladder as well. Right lower renal pole 0.9 cm cyst image 37. Kidneys are otherwise unremarkable. Adrenal glands, spleen, and pancreas are unremarkable.  The appendix is normal. Posterior uterine body fibroid incidentally noted. Uterus and ovaries are otherwise normal. Bladder is normal. No bowel wall thickening or focal segmental dilatation. A few foci of free retroperitoneal gas are noted along the right psoas muscle. No new acute osseous abnormality.  IMPRESSION: Contained retro duodenal collection of fluid/ contrast/ gas at the site of previously seen perforation.  Interval increase in pleural effusions and retroperitoneal fluid.   Electronically Signed   By: Christiana Pellant M.D.   On: 04/24/2014 14:14    Scheduled Meds: . acetaminophen  1,000 mg Intravenous Once  . antiseptic oral rinse  15 mL Mouth Rinse q12n4p  . chlorhexidine  15 mL Mouth Rinse BID  .  insulin aspart  0-15 Units Subcutaneous 6 times per day  . morphine   Intravenous 6 times per day  . pantoprazole (PROTONIX) IV  40 mg Intravenous Q12H  . piperacillin-tazobactam (ZOSYN)  IV  3.375 g Intravenous 3 times per day  . potassium chloride  10 mEq Intravenous Q1 Hr x 5   Continuous Infusions: . sodium chloride 10 mL/hr at 04/23/14 1430  . Marland KitchenTPN (CLINIMIX-E) Adult 80 mL/hr at 04/25/14 1742   And  . fat emulsion 250 mL (04/25/14 1742)    Principal Problem:   Retroperitoneal air: Post ERCP retroperitoneal perforation Active Problems:   Common bile duct stone   Choledocholithiasis   ARF (acute renal failure)   Fever, unspecified    Time spent: 35 MINS    [facility] MD [facility] Pager [phone]. If 7PM-7AM, please contact night-coverage at [email or web address], password [facility] 04/26/2014, 9:54 AM  LOS: 9 days

## 2014-04-26 NOTE — Progress Notes (Signed)
PARENTERAL NUTRITION CONSULT NOTE - Follow Up  Pharmacy Consult for TPN Indication: Duodenal perforation post ERCP.  Allergies  Allergen Reactions  . Codeine Nausea Only   Patient Measurements: Height: 5\' 6"  (167.6 cm) Weight: 180 lb 1.6 oz (81.693 kg) IBW/kg (Calculated) : 59.3 Adjusted Body Weight: 66kg  Vital Signs: Temp: 100.1 F (37.8 C) (07/19 0653) Temp src: Oral (07/19 0653) BP: 130/71 mmHg (07/19 0456) Pulse Rate: 101 (07/19 0456) Intake/Output from previous day: 07/18 0701 - 07/19 0700 In: 550 [IV Piggyback:550] Out: 3500 [Urine:3500] Intake/Output from this shift:    Labs:  Recent Labs  04/24/14 0540 04/25/14 0450 04/26/14 0850  WBC 8.4 7.7 10.4  HGB 11.1* 10.4* 11.3*  HCT 31.3* 30.0* 32.5*  PLT 205 194 228    Recent Labs  04/24/14 0540 04/25/14 0450 04/25/14 0452 04/25/14 1405 04/26/14 0630  NA 137 136*  --  136* 137  K 3.4* 3.0*  --  3.5* 3.2*  CL 99 95*  --  97 95*  CO2 29 31  --  30 33*  GLUCOSE 119* 124*  --  127* 128*  BUN 12 10  --  10 12  CREATININE 0.79 0.79  --  0.71 0.77  CALCIUM 8.1* 7.9*  --  8.1* 8.1*  MG  --   --  1.8  --  1.9  PHOS 2.2* 3.3  --   --  3.6  PROT 5.5*  --   --   --   --   ALBUMIN 2.0*  --   --   --   --   AST 17  --   --   --   --   ALT 27  --   --   --   --   ALKPHOS 61  --   --   --   --   BILITOT 0.7  --   --   --   --    Estimated Creatinine Clearance: 83.7 ml/min (by C-G formula based on Cr of 0.77).    Recent Labs  04/26/14 04/26/14 0429 04/26/14 0732  GLUCAP 146* 138* 129*   Medications:  Scheduled:  . antiseptic oral rinse  15 mL Mouth Rinse q12n4p  . chlorhexidine  15 mL Mouth Rinse BID  . insulin aspart  0-15 Units Subcutaneous 6 times per day  . morphine   Intravenous 6 times per day  . pantoprazole (PROTONIX) IV  40 mg Intravenous Q12H  . piperacillin-tazobactam (ZOSYN)  IV  3.375 g Intravenous 3 times per day  . potassium chloride  10 mEq Intravenous Q1 Hr x 5   Infusions:  .  sodium chloride 10 mL/hr at 04/23/14 1430  . Marland Kitchen.TPN (CLINIMIX-E) Adult 80 mL/hr at 04/25/14 1742   And  . fat emulsion 250 mL (04/25/14 1742)   Insulin Requirements in the past 24 hours:  10 units Novolog SSI/24h  Current Nutrition:  Diet: NPO Clinimix E 5/15 at 50 ml/hr Lipids 20% at 10 ml/hr  [facility]  Assessment: 58yo F unwent ERCP w/ sphincterotomy 04/19/14, developed RUQ and epigastric pain and elevated lipase a few hours later. Pancreatitis was suspected but lipase resolved and a CT showed perforation of the distal CBP at duodemum. Patient has remained non-toxic appearing so treating medically for now. Pharmacy is asked to start TPN to allow bowel rest. She was eating normally prior to this admission.   Glucose: Within goal of 150mg /dl but requiring insulin  Electrolytes: K = 3.2 (orders  to replace), Phosphorus 3.6 (s/p total 40 mmol K Phos 7/16 and 7/17), Corr Ca 9.5  Renal: SCr WNL, I/O = - 2.9L  LFTs:- improved to wnl. Lipase wnl.  TGs: 104  Prealbumin: 6.0  Concern for fluid retention, IV rate decreased and Lasix 7/16 and 7/17 with good diuresis  Nutritional Goals:  RD recs: Kcal 1800-2000, Protein 90-105 gm, Fluid requirement >/= 1800 ml all per 24 hr Clinimix E 5/15 at a goal rate of 34ml/hr + 20% fat emulsion at 44ml/hr to provide: 96g/day protein, 1843Kcal/day.  TPN Access: PICC placed  TPN day#: 5  Plan: At 1800 today:  Continue Clinimix E 5/15 at goal 42ml/hr.  20% fat emulsion at 45ml/hr.  TNA to contain standard multivitamins and trace elements daily  NS at 10 m/hr  Novolog SSI moderate q4h, likely decrease CBG to  q6h when closer to goal range  TNA lab panels on Mondays & Thursdays.  Juliette Alcide, PharmD, BCPS.   Pager: [phone] 04/26/2014, 11:17 AM

## 2014-04-26 NOTE — Progress Notes (Signed)
Patient ID: Krista Chapman, female   DOB: June 26, 1956, 58 y.o.   MRN: [id number] [facility] Gastroenterology Progress Note  Krista Chapman 58 y.o. 12/15/1955   Subjective: Abdominal pain slightly better with palpation. Denies N/V. Low grade temps. Reclined in chair.  Objective: Vital signs in last 24 hours: Filed Vitals:   04/26/14 0800  BP: 130/71  Pulse: 101  Temp: 100.1  Resp: 20    Physical Exam: Gen: lethargic, no acute distress CV: RRR Chest: Coarse breath sounds Abd: diffusely tender (greatest in RLQ), soft, nondistended, +BS  Lab Results:  Recent Labs  04/25/14 0450 04/25/14 0452 04/25/14 1405 04/26/14 0630  NA 136*  --  136* 137  K 3.0*  --  3.5* 3.2*  CL 95*  --  97 95*  CO2 31  --  30 33*  GLUCOSE 124*  --  127* 128*  BUN 10  --  10 12  CREATININE 0.79  --  0.71 0.77  CALCIUM 7.9*  --  8.1* 8.1*  MG  --  1.8  --  1.9  PHOS 3.3  --   --  3.6    Recent Labs  04/24/14 0540  AST 17  ALT 27  ALKPHOS 61  BILITOT 0.7  PROT 5.5*  ALBUMIN 2.0*    Recent Labs  04/25/14 0450 04/26/14 0850  WBC 7.7 10.4  NEUTROABS  --  7.8*  HGB 10.4* 11.3*  HCT 30.0* 32.5*  MCV 90.1 90.8  PLT 194 228   No results found for this basename: LABPROT, INR,  in the last 72 hours    Assessment/Plan: 58 yo with retroperitoneal perforation following ERCP with stone extraction with continued abdominal pain and low grade temps. BCx NGTD. WBC has risen to 10.4. Continue broad-spectrum antibiotics with IV Zosyn, bowel rest, TPN, IVFs. Will follow.       Catrell Morrone C. 04/26/2014, 10:51 AM

## 2014-04-27 ENCOUNTER — Inpatient Hospital Stay (HOSPITAL_COMMUNITY): Payer: BC Managed Care – PPO

## 2014-04-27 LAB — COMPREHENSIVE METABOLIC PANEL
ALT: 21 U/L (ref 0–35)
AST: 21 U/L (ref 0–37)
Albumin: 2 g/dL — ABNORMAL LOW (ref 3.5–5.2)
Alkaline Phosphatase: 72 U/L (ref 39–117)
Anion gap: 10 (ref 5–15)
BUN: 14 mg/dL (ref 6–23)
CALCIUM: 8.4 mg/dL (ref 8.4–10.5)
CO2: 33 meq/L — AB (ref 19–32)
CREATININE: 0.76 mg/dL (ref 0.50–1.10)
Chloride: 95 mEq/L — ABNORMAL LOW (ref 96–112)
Glucose, Bld: 122 mg/dL — ABNORMAL HIGH (ref 70–99)
Potassium: 3.3 mEq/L — ABNORMAL LOW (ref 3.7–5.3)
Sodium: 138 mEq/L (ref 137–147)
Total Bilirubin: 0.6 mg/dL (ref 0.3–1.2)
Total Protein: 5.8 g/dL — ABNORMAL LOW (ref 6.0–8.3)

## 2014-04-27 LAB — DIFFERENTIAL
BASOS ABS: 0.1 10*3/uL (ref 0.0–0.1)
Basophils Relative: 1 % (ref 0–1)
EOS ABS: 0.5 10*3/uL (ref 0.0–0.7)
Eosinophils Relative: 4 % (ref 0–5)
LYMPHS PCT: 12 % (ref 12–46)
Lymphs Abs: 1.5 10*3/uL (ref 0.7–4.0)
Monocytes Absolute: 1.1 10*3/uL — ABNORMAL HIGH (ref 0.1–1.0)
Monocytes Relative: 9 % (ref 3–12)
Neutro Abs: 9.3 10*3/uL — ABNORMAL HIGH (ref 1.7–7.7)
Neutrophils Relative %: 74 % (ref 43–77)

## 2014-04-27 LAB — URINE MICROSCOPIC-ADD ON

## 2014-04-27 LAB — PREALBUMIN: Prealbumin: 4.8 mg/dL — ABNORMAL LOW (ref 17.0–34.0)

## 2014-04-27 LAB — GLUCOSE, CAPILLARY
Glucose-Capillary: 141 mg/dL — ABNORMAL HIGH (ref 70–99)
Glucose-Capillary: 109 mg/dL — ABNORMAL HIGH (ref 70–99)
Glucose-Capillary: 172 mg/dL — ABNORMAL HIGH (ref 70–99)
Glucose-Capillary: 131 mg/dL — ABNORMAL HIGH (ref 70–99)

## 2014-04-27 LAB — URINALYSIS, ROUTINE W REFLEX MICROSCOPIC
Bilirubin Urine: NEGATIVE
GLUCOSE, UA: NEGATIVE mg/dL
HGB URINE DIPSTICK: NEGATIVE
Ketones, ur: NEGATIVE mg/dL
Leukocytes, UA: NEGATIVE
Nitrite: NEGATIVE
Protein, ur: 30 mg/dL — AB
SPECIFIC GRAVITY, URINE: 1.02 (ref 1.005–1.030)
UROBILINOGEN UA: 1 mg/dL (ref 0.0–1.0)
pH: 8 (ref 5.0–8.0)

## 2014-04-27 LAB — CBC
HCT: 30.9 % — ABNORMAL LOW (ref 36.0–46.0)
HEMOGLOBIN: 10.5 g/dL — AB (ref 12.0–15.0)
MCH: 31.2 pg (ref 26.0–34.0)
MCHC: 34 g/dL (ref 30.0–36.0)
MCV: 91.7 fL (ref 78.0–100.0)
Platelets: 260 10*3/uL (ref 150–400)
RBC: 3.37 MIL/uL — ABNORMAL LOW (ref 3.87–5.11)
RDW: 13.3 % (ref 11.5–15.5)
WBC: 12.5 10*3/uL — ABNORMAL HIGH (ref 4.0–10.5)

## 2014-04-27 LAB — MAGNESIUM: Magnesium: 2.1 mg/dL (ref 1.5–2.5)

## 2014-04-27 LAB — LIPASE, BLOOD: Lipase: 55 U/L (ref 11–59)

## 2014-04-27 LAB — TRIGLYCERIDES: TRIGLYCERIDES: 90 mg/dL (ref <150)

## 2014-04-27 LAB — PHOSPHORUS: PHOSPHORUS: 4 mg/dL (ref 2.3–4.6)

## 2014-04-27 MED ORDER — FAT EMULSION 20 % IV EMUL
250.0000 mL | INTRAVENOUS | Status: DC
  Administered 2014-04-27: 250 mL via INTRAVENOUS
  Filled 2014-04-27: qty 250

## 2014-04-27 MED ORDER — TRACE MINERALS CR-CU-F-FE-I-MN-MO-SE-ZN IV SOLN
INTRAVENOUS | Status: DC
  Administered 2014-04-27: 18:00:00 via INTRAVENOUS
  Filled 2014-04-27: qty 2000

## 2014-04-27 MED ORDER — VANCOMYCIN HCL IN DEXTROSE 1-5 GM/200ML-% IV SOLN: 1000.0000 mg | Freq: Three times a day (TID) | INTRAVENOUS | Status: DC

## 2014-04-27 MED ORDER — HYDROCORTISONE 1 % EX CREA: TOPICAL_CREAM | CUTANEOUS | Status: AC | PRN

## 2014-04-27 MED ORDER — VANCOMYCIN HCL IN DEXTROSE 1-5 GM/200ML-% IV SOLN
1000.0000 mg | Freq: Three times a day (TID) | INTRAVENOUS | Status: DC
  Administered 2014-04-27 (×2): 1000 mg via INTRAVENOUS
  Filled 2014-04-27 (×3): qty 200

## 2014-04-27 MED ORDER — PIPERACILLIN-TAZOBACTAM 3.375 G IVPB
3.3750 g | Freq: Three times a day (TID) | INTRAVENOUS | Status: DC
Start: 2014-04-27 — End: 2016-07-26

## 2014-04-27 MED ORDER — POTASSIUM CHLORIDE 10 MEQ/100ML IV SOLN
10.0000 meq | INTRAVENOUS | Status: AC
  Administered 2014-04-27 (×5): 10 meq via INTRAVENOUS
  Filled 2014-04-27 (×5): qty 100

## 2014-04-27 MED ORDER — BISACODYL 10 MG RE SUPP: 10.0000 mg | Freq: Every day | RECTAL | Status: DC | PRN

## 2014-04-27 NOTE — Discharge Summary (Signed)
Physician Discharge Summary  Krista Chapman IDH:[id number] DOB: Feb 17, 1956 DOA: 04/17/2014  PCP: Neldon Labella, MD  Admit date: 04/17/2014 Discharge date: 04/27/2014  Time spent: 70 minutes  Recommendations for Outpatient Follow-up:  1. Followup with M.D. at [facility].  Discharge Diagnoses:  Principal Problem:   Retroperitoneal air: Post ERCP retroperitoneal perforation Active Problems:   Common bile duct stone   Choledocholithiasis   ARF (acute renal failure)   Fever, unspecified   Discharge Condition: Stable  Diet recommendation: NPO  Filed Weights   04/25/14 1737 04/26/14 0456 04/27/14 0609  Weight: 85.775 kg (189 lb 1.6 oz) 81.693 kg (180 lb 1.6 oz) 83.371 kg (183 lb 12.8 oz)    History of present illness:  58 y.o. female who presented to the Emergency Department complaining of intermittent, moderate RUQ abdominal pain x 1 hour after eating. Pt also stated pain radiated around to her mid back. She initially noted these symptoms about 3 years ago but have noted episodes coming more frequently. Pt states episodes come about once weekly lasting for several minutes to hours at a time. Pt was treated in the ED with last episode and was told symptoms were related to a "gallbadder attack". Current pain is exacerbated with palpation and deep breathing. Symptoms alleviated when leaning forward. At this time she denies any nausea, vomiting, fever, chest pain, or SOB. She has been using milk thistle and lemon water as a part of a GI cleansing routine.  Pt mentioned undergoing a spinal tap 1 day prior to admission to rule out MS. However, pain was not located at injection site, nor does she associate symptoms with procedure.  Patient is currently undergoing workup at [facility] for intermittent distal lower extremity pain and burning sensation. She also notices tingling in the tips of her fingers occasionally. The patient has had EMG and nerve conduction studies, MRI of  the brain and C-spine and lumbar puncture part of her workup.  No history of cardiopulmonary issues      Hospital Course:  #1 post ERCP duodenal perforation with retroperitoneal perforation  Post ERCP patient had continued abdominal pain. CT of the abdomen and pelvis was subsequently done on 04/20/2049 which showed perforation of the mid duodenum and no distal, bowel duct with extensive retroperitoneal fluid and gas. There is ascites but no pneumoperitoneum. Patient was subsequently maintained on bowel rest placed on IV fluids and started on empiric IV Zosyn. Patient did not have any peritoneal signs. LFTs were followed which were trending down. Patient WBC was followed and initially trended down. Patient was followed by general surgery and gastroenterology throughout the hospitalization. It was felt conservative treatment was needed at that time. Patient remained stable. No peritoneal signs. LFTs trending down. White count started to increase back up and now at 12.5. Patient continued to have some low-grade fevers. Patient was maintained on IV Zosyn. Antibiotic coverage was broadened and patient was started on IV vancomycin. Patient was also started on TNA for nutrition. Patient was also maintained on bowel rest. Patient was followed throughout the hospitalization by GI and general surgery. Repeat urinalysis which was done was negative. Repeat chest x-ray which was done was negative. Blood cultures were obtained with no growth to date. Patient had requested the records to be sent to [facility] for review which they were. Patient requested transfer to [facility] for further evaluation and management. Patient will be transferred to [facility] in stable condition. #2 choledocholithiasis/acute pancreatitis  Patient on admission was noted to  have intermittent epigastric abdominal pain with radiation to the back that progressively worsened over the past couple months prior to admission. Patient had LFTs done  with AST and ALT elevated with a normal bilirubin. Abdominal ultrasound which was done showed gallstones without cholecystitis, a dilated bowel duct with a 5 mm bile duct stone. GI consultation was obtained and patient was seen in consultation by Dr. Dulce Sellar. Patient subsequently underwent ERCP with stone removal. Patient noted continued abdominal pain postprocedure and noted to have acute pancreatitis status post ERCP. patient was maintained on bowel rest, placed on IV fluids, supportive care, pain management. Patient be continued with no significant improvement and CT of the abdomen and pelvis was done which showed duodenal perforation with retroperitoneal perforation. Patient was subsequently placed on empiric IV Zosyn and followed. General surgical consultation was also obtained on admission. Patient was followed by general surgery and GI during the hospitalization. Patient's lipase trended down. With slow improvement. Patient requested transfer to [facility].  #3 acute renal failure  Likely secondary to prerenal azotemia. Resolved with hydration. Follow.  #4 fever  Likely secondary to problem #1. Blood cultures were drawn twice and pending at time of discharge. x2 pending. Repeat CT abdomen and pelvis with less contrast extravasation and perforation appears to be walling off. Patient still with low grade fevers. Urinalysis was ordered on the day of discharge with cultures and sensitivities which was negative for UTI. Chest x-ray was negative. Patient was maintained on IV Zosyn. Antibiotic coverage was broadened with addition of IV vancomycin. Patient requested to be transferred to [facility]. Patient will be transferred to the care of Dr. Nile Riggs..     Procedures: ERCP with sphincterectomy/papillotomy and ERCP with removal of calculus/calculi 04/19/2014 per Dr. Dulce Sellar  CT abdomen and pelvis 04/20/2014, 04/24/2014  Abdominal ultrasound 04/17/2014   Consultations: GI: Dr Dulce Sellar 04/18/14   CCS: Dr Johna Sheriff 04/18/14   Discharge Exam: Filed Vitals:   04/27/14 1345  BP: 125/78  Pulse: 96  Temp: 99.4 F (37.4 C)  Resp: 18    General: nad Cardiovascular: rrr Respiratory: CTAB  Discharge Instructions You were cared for by a hospitalist during your hospital stay. If you have any questions about your discharge medications or the care you received while you were in the hospital after you are discharged, you can call the unit and asked to speak with the hospitalist on call if the hospitalist that took care of you is not available. Once you are discharged, your primary care physician will handle any further medical issues. Please note that NO REFILLS for any discharge medications will be authorized once you are discharged, as it is imperative that you return to your primary care physician (or establish a relationship with a primary care physician if you do not have one) for your aftercare needs so that they can reassess your need for medications and monitor your lab values.     Medication List         ALPRAZolam 0.25 MG tablet  Commonly known as:  XANAX  Take 0.25 mg by mouth at bedtime as needed for anxiety.     amLODipine 2.5 MG tablet  Commonly known as:  NORVASC  TAKE 1 TABLET (2.5 MG TOTAL) BY MOUTH DAILY. <must make appointment at Dr. Hazle Coca office for future refills>     bisacodyl 10 MG suppository  Commonly known as:  DULCOLAX  Place 1 suppository (10 mg total) rectally daily as needed for moderate constipation.     CALCIUM 600+D  HIGH POTENCY 600-400 MG-UNIT per tablet  Generic drug:  Calcium Carbonate-Vitamin D  Take 1 tablet by mouth daily.     cholecalciferol 1000 UNITS tablet  Commonly known as:  VITAMIN D  Take 10,000 Units by mouth daily.     FISH OIL PO  Take by mouth.     fluticasone 50 MCG/ACT nasal spray  Commonly known as:  FLONASE  Place 1 spray into both nostrils daily as needed.     GOODYS BODY PAIN PO  Take 2 each by mouth daily as  needed (body aches).     hydrocortisone cream 1 %  Apply topically as needed for itching.     MILK THISTLE EXTRACT PO  Take 3 tablets by mouth daily.     piperacillin-tazobactam 3.375 GM/50ML IVPB  Commonly known as:  ZOSYN  Inject 50 mLs (3.375 g total) into the vein every 8 (eight) hours.     vancomycin 1 GM/200ML Soln  Commonly known as:  VANCOCIN  Inject 200 mLs (1,000 mg total) into the vein every 8 (eight) hours.       Allergies  Allergen Reactions  . Codeine Nausea Only      The results of significant diagnostics from this hospitalization (including imaging, microbiology, ancillary and laboratory) are listed below for reference.    Significant Diagnostic Studies: Ct Abdomen Pelvis Wo Contrast  04/20/2014   CLINICAL DATA:  Abdominal pain after ERCP  EXAM: CT ABDOMEN AND PELVIS WITHOUT CONTRAST  TECHNIQUE: Multidetector CT imaging of the abdomen and pelvis was performed following the standard protocol without IV contrast.  COMPARISON:  06/09/2010  FINDINGS: BODY WALL: Unremarkable.  LOWER CHEST: Small bilateral pleural effusions with right basilar atelectasis.  ABDOMEN/PELVIS:  Liver: No focal abnormality.  Biliary: Gallstones noted within the gallbladder. There is diffuse pneumobilia after recent ERCP. High-density material within the distal common bile duct is likely enteric contrast. There is extensive right retroperitoneal fluid, gas, and contrast extravasation, mainly collecting in the posterior para renal space with continuation into the pelvis. A defect is visible near the level of the distal common bile duct. Given the recent procedure, it is unlikely that any of these collections represent abscess. There is small volume ascites, partially imaged in the pelvis. No pneumoperitoneum within the upper abdomen.  Pancreas: Unremarkable.  Spleen: Unremarkable.  Adrenals: Unremarkable.  Kidneys and ureters: Displacement of the right kidney by the retroperitoneal fluid collection.   Bladder: Unremarkable.  Reproductive: Unremarkable.  Bowel: Luminal perforation about that the duodenum or biliary tree as above. Normal appendix.  Retroperitoneum: No mass or adenopathy.  Peritoneum: No ascites or pneumoperitoneum.  Vascular: No acute abnormality.  OSSEOUS: No acute abnormalities.  Critical Value/emergent results were called by telephone at the time of interpretation on 04/20/2014 at 11:08 PM to Dr. [name], who verbally acknowledged these results.  IMPRESSION: Perforation of the mid duodenum and/or distal common bile duct with extensive retroperitoneal fluid and gas. There is ascites but no pneumoperitoneum.   Electronically Signed   By: Tiburcio Pea M.D.   On: 04/20/2014 23:21   Dg Chest 2 View  04/27/2014   CLINICAL DATA:  Fever.  Abdominal and back pain.  EXAM: CHEST  2 VIEW  COMPARISON:  05/12/2011  FINDINGS: Right arm PICC has its tip at the SVC RA junction. Heart size is normal. Mediastinal shadows are normal. The lungs are clear. No effusions. No bony abnormalities.  IMPRESSION: PICC line well positioned.  Normal chest.   Electronically Signed   By: Loraine Leriche  Shogry M.D.   On: 04/27/2014 10:41   Mr Cervical Spine W Wo Contrast  04/02/2014   [facility]  NEUROIMAGING REPORT   STUDY DATE: 04/02/14 PATIENT NAME: KARLE DESROSIER DOB: [birth year] MRN: [id number]  ORDERING CLINICIAN: Elspeth Cho, DO  CLINICAL HISTORY: 58 year old female with numbness. EXAM: MRI brain (with and without)  TECHNIQUE: MRI of the cervical spine was obtained utilizing 3 mm sagittal  slices from the posterior fossa down to the T3-4 level with T1, T2 and  inversion recovery views. In addition 4 mm axial slices from C2-3 down to  T1-2 level were included with T2 and gradient echo views. CONTRAST: 16ml magnevist IMAGING SITE: [facility] [address] (1.5 Tesla MRI)   FINDINGS:  On sagittal views the vertebral bodies have normal height and alignment.   Degenerative spondylosis and disc bulging from C3-4 to  C6-7. The spinal  cord is normal in size and appearance. The posterior fossa, pituitary  gland and paraspinal soft tissues are unremarkable.   On axial views: C2-3: no spinal stenosis or foraminal narrowing C3-4: disc bulging with no spinal stenosis or foraminal narrowing  C4-5: no spinal stenosis or foraminal narrowing  C5-6: uncovertebral joint hypertrophy and facet hypertrophy with moderate  left foraminal stenosis  C6-7: uncovertebral joint hypertrophy with mild left foraminal stenosis  C7-T1: no spinal stenosis or foraminal narrowing  T1-2: no spinal stenosis or foraminal narrowing   Limited views of the soft tissues of the head and neck are unremarkable.  No abnormal enhancing lesions.    04/02/2014   Abnormal MRI cervical spine (with and without) demonstrating: 1. At C5-6: uncovertebral joint hypertrophy and facet hypertrophy with  moderate left foraminal stenosis. 2. At C6-7: uncovertebral joint hypertrophy with mild left foraminal  stenosis. 3. No intrinsic or abnormal enhancing lesions.    INTERPRETING PHYSICIAN:  Suanne Marker, MD Certified in Neurology, Neurophysiology and Neuroimaging  [facility] [address] [place], [place] [address] [phone]   US Abdomen Complete  04/17/2014   CLINICAL DATA:  Right upper quadrant pain  EXAM: ULTRASOUND ABDOMEN COMPLETE  COMPARISON:  Ultrasound of June 09, 2010.  FINDINGS: Gallbladder:  The gallbladder is adequately distended and contains multiple echogenic mobile shadowing stones. There is no gallbladder wall thickening or pericholecystic fluid. There is no positive sonographic Murphy's sign.  Common bile duct:  Diameter: The common bile duct is dilated to 10.7 mm and there is a 5 mm shadowing stone within the duct.  Liver:  No focal lesion identified. Within normal limits in parenchymal echogenicity.  IVC:  No abnormality visualized.  Pancreas:  Visualized portion unremarkable.  Spleen:  Size and appearance within  normal limits.  Right Kidney:  Length: 9 cm. Echogenicity within normal limits. No mass or hydronephrosis visualized.  Left Kidney:  Length: 9.3 cm. Echogenicity within normal limits. No mass or hydronephrosis visualized.  Abdominal aorta:  No aneurysm visualized.  Other findings:  None.  IMPRESSION: There are multiple mobile gallstones without sonographic evidence of acute cholecystitis. There is a 5mm common bile duct stone and the common bile duct is dilated to 10.7 mm.  These results were called by telephone at the time of interpretation on 04/17/2014 at 2:59 PM to Dr. Santiago Glad , who verbally acknowledged these results.   Electronically Signed   By: David  [place]   On: 04/17/2014 15:00   Ct Abdomen Pelvis W Contrast  04/24/2014   CLINICAL DATA:  Abdominal pain.  Duodenal perforation.  Post ERCP.  EXAM: CT ABDOMEN AND PELVIS WITH CONTRAST  TECHNIQUE: Multidetector CT imaging of the abdomen and pelvis was performed using the standard protocol following bolus administration of intravenous contrast.  CONTRAST:  100mL OMNIPAQUE IOHEXOL 300 MG/ML  SOLN  COMPARISON:  04/20/2014  FINDINGS: Increase in small bilateral pleural effusions noted with associated compressive atelectasis. Biliary gas is present. There is mild reflux of contrast through the ampulla to the common duct. There is contained extravasation of oral contrast and gas posterior to the second portion of the duodenum, 2.6 x 1.4 cm, at the site of previously identified perforation. A large amount of retroperitoneal fluid is reidentified tracking to the pelvis along the level of the pelvic sidewalls. Trace pelvic cul-de-sac fluid is also identified. No focal hepatic abnormality. Gallstones are noted with gas/fluid level within the gallbladder as well. Right lower renal pole 0.9 cm cyst image 37. Kidneys are otherwise unremarkable. Adrenal glands, spleen, and pancreas are unremarkable.  The appendix is normal. Posterior uterine body fibroid  incidentally noted. Uterus and ovaries are otherwise normal. Bladder is normal. No bowel wall thickening or focal segmental dilatation. A few foci of free retroperitoneal gas are noted along the right psoas muscle. No new acute osseous abnormality.  IMPRESSION: Contained retro duodenal collection of fluid/ contrast/ gas at the site of previously seen perforation.  Interval increase in pleural effusions and retroperitoneal fluid.   Electronically Signed   By: Christiana PellantGretchen  Green M.D.   On: 04/24/2014 14:14   Dg Ercp With Sphincterotomy  04/19/2014   CLINICAL DATA:  ERCP with sphincterotomy for common bile duct stones.  EXAM: ERCP  TECHNIQUE: Multiple spot images obtained with the fluoroscopic device and submitted for interpretation post-procedure.  FLUOROSCOPY TIME:  2 min, 43 second  COMPARISON:  Abdominal ultrasound - 04/17/2014; CT abdomen pelvis- 06/09/2010  FINDINGS: Six spot intraoperative fluoroscopic images during ERCP are provided for review.  Initial image demonstrates an ERCP probe overlying the right upper abdominal quadrant. There is subsequent selective cannulation opacification of the common bile duct.  There are 2 discrete filling defects within the distal aspect of the CBD. There is instillation of a biliary sphincterotomy balloon within in the central aspect of the common bile duct.  Completion image demonstrates apparent retraction of the CBD stones.  There is minimal opacification of the gallbladder with multiple filling defects compatible with the cholelithiasis demonstrated on preceding abdominal ultrasound.  There is minimal opacification of the central aspect of the intrahepatic biliary tree which appears nondilated. There is no definitive opacification of the pancreatic duct.  IMPRESSION: 1. ERCP with stone retraction, biliary sweeping and presumed sphincterotomy as above. 2. Cholelithiasis as demonstrated on preceding abdominal ultrasound. These images were submitted for radiologic  interpretation only. Please see the procedural report for the amount of contrast and the fluoroscopy time utilized.   Electronically Signed   By: Simonne ComeJohn  Watts M.D.   On: 04/19/2014 12:19   Dg Fluoro Guide Lumbar Puncture  04/16/2014   CLINICAL DATA:  Abnormal MRI the brain. Multiple sclerosis. Numbness in the hands. Visual disturbances.  EXAM: DIAGNOSTIC LUMBAR PUNCTURE UNDER FLUOROSCOPIC GUIDANCE  FLUOROSCOPY TIME:  18 seconds  PROCEDURE: Informed consent was obtained from the patient prior to the procedure, including potential complications of headache, allergy, and pain. With the patient prone, the lower back was prepped with Betadine. 1% Lidocaine was used for local anesthesia. Lumbar puncture was performed at the right paramedian L2-3 level using a 20 gauge needle with return of clear CSF  with an opening pressure of 16.5 cm water. 12.26ml of CSF were obtained for laboratory studies. The patient tolerated the procedure well and there were no apparent complications.  IMPRESSION: Technically successful fluoroscopic guided lumbar puncture at the L2-3 level from a right paramedian approach.  Normal opening pressure. Clear fluid. 12.0 mL of CSF was obtained for laboratory studies.   Electronically Signed   By: Gennette Pac M.D.   On: 04/16/2014 10:14    Microbiology: Recent Results (from the past 240 hour(s))  SURGICAL PCR SCREEN     Status: None   Collection Time    04/20/14  2:47 AM      Result Value Ref Range Status   MRSA, PCR NEGATIVE  NEGATIVE Final   Staphylococcus aureus NEGATIVE  NEGATIVE Final   Comment:            The Xpert SA Assay (FDA     approved for NASAL specimens     in patients over 52 years of age),     is one component of     a comprehensive surveillance     program.  Test performance has     been validated by [employer] for patients greater     than or equal to 38 year old.     It is not intended     to diagnose infection nor to     guide or monitor treatment.   CULTURE, BLOOD (ROUTINE X 2)     Status: None   Collection Time    04/24/14 11:20 AM      Result Value Ref Range Status   Specimen Description BLOOD LEFT ARM   Final   Special Requests BOTTLES DRAWN AEROBIC AND ANAEROBIC 5CC EACH   Final   Culture  Setup Time     Final   Value: 04/24/2014 13:44     Performed at [facility]   Culture     Final   Value:        BLOOD CULTURE RECEIVED NO GROWTH TO DATE CULTURE WILL BE HELD FOR 5 DAYS BEFORE ISSUING A FINAL NEGATIVE REPORT     Performed at [facility]   Report Status PENDING   Incomplete  CULTURE, BLOOD (ROUTINE X 2)     Status: None   Collection Time    04/24/14 11:25 AM      Result Value Ref Range Status   Specimen Description BLOOD LEFT HAND   Final   Special Requests BOTTLES DRAWN AEROBIC ONLY 4CC   Final   Culture  Setup Time     Final   Value: 04/24/2014 13:44     Performed at [facility]   Culture     Final   Value:        BLOOD CULTURE RECEIVED NO GROWTH TO DATE CULTURE WILL BE HELD FOR 5 DAYS BEFORE ISSUING A FINAL NEGATIVE REPORT     Performed at [facility]   Report Status PENDING   Incomplete     Labs: Basic Metabolic Panel:  Recent Labs Lab 04/22/14 0428 04/23/14 0525 04/24/14 0540 04/25/14 0450 04/25/14 0452 04/25/14 1405 04/26/14 0630 04/27/14 0615  NA 139 139 137 136*  --  136* 137 138  K 4.3 3.9 3.4* 3.0*  --  3.5* 3.2* 3.3*  CL 105 105 99 95*  --  97 95* 95*  CO2 23 25 29 31   --  30 33* 33*  GLUCOSE 131* 148* 119*  124*  --  127* 128* 122*  BUN 12 10 12 10   --  10 12 14   CREATININE 0.93 0.68 0.79 0.79  --  0.71 0.77 0.76  CALCIUM 7.9* 8.0* 8.1* 7.9*  --  8.1* 8.1* 8.4  MG 1.6 2.3  --   --  1.8  --  1.9 2.1  PHOS  --  1.3* 2.2* 3.3  --   --  3.6 4.0   Liver Function Tests:  Recent Labs Lab 04/21/14 0413 04/22/14 0428 04/23/14 0525 04/24/14 0540 04/27/14 0615  AST 31 21 17 17 21   ALT 85* 52* 37* 27 21  ALKPHOS 71 53 49 61 72  BILITOT 1.4* 0.8 0.7 0.7 0.6   PROT 5.4* 5.3* 5.5* 5.5* 5.8*  ALBUMIN 2.5* 2.0* 2.1* 2.0* 2.0*    Recent Labs Lab 04/20/14 1905 04/21/14 0413 04/22/14 0428 04/24/14 0540 04/27/14 0615  LIPASE 66* 50 32 68* 55   No results found for this basename: AMMONIA,  in the last 168 hours CBC:  Recent Labs Lab 04/21/14 1330 04/22/14 0428 04/23/14 0525 04/24/14 0540 04/25/14 0450 04/26/14 0850 04/27/14 0615  WBC  --  8.4 8.9 8.4 7.7 10.4 12.5*  NEUTROABS 9.5* 7.0 7.1  --   --  7.8* 9.3*  HGB  --  13.1 12.6 11.1* 10.4* 11.3* 10.5*  HCT  --  38.1 36.6 31.3* 30.0* 32.5* 30.9*  MCV  --  91.1 90.8 90.5 90.1 90.8 91.7  PLT  --  175 221 205 194 228 260   Cardiac Enzymes: No results found for this basename: CKTOTAL, CKMB, CKMBINDEX, TROPONINI,  in the last 168 hours BNP: BNP (last 3 results) No results found for this basename: PROBNP,  in the last 8760 hours CBG:  Recent Labs Lab 04/26/14 1947 04/26/14 2341 04/27/14 0353 04/27/14 0739 04/27/14 1140  GLUCAP 159* 148* 141* 109* 172*       Signed:  Rayshaun Needle MD [facility] 04/27/2014, 3:05 PM

## 2014-04-27 NOTE — Progress Notes (Signed)
ANTIBIOTIC CONSULT NOTE - FOLLOW UP  Pharmacy Consult for Zosyn, Vancomycin Indication: intra-abdominal infection, fever  Allergies  Allergen Reactions  . Codeine Nausea Only   Patient Measurements: Height: 5\' 6"  (167.6 cm) Weight: 183 lb 12.8 oz (83.371 kg) IBW/kg (Calculated) : 59.3  Vital Signs: Temp: 98.8 F (37.1 C) (07/20 0609) Temp src: Oral (07/20 0609) BP: 126/61 mmHg (07/20 0609) Pulse Rate: 98 (07/20 0609) Intake/Output from previous day: 07/19 0701 - 07/20 0700 In: 1070 [I.V.:40; IV Piggyback:550; TPN:480] Out: 2900 [Urine:2900] Intake/Output from this shift:    Labs:  Recent Labs  04/25/14 0450 04/25/14 1405 04/26/14 0630 04/26/14 0850 04/27/14 0615  WBC 7.7  --   --  10.4 12.5*  HGB 10.4*  --   --  11.3* 10.5*  PLT 194  --   --  228 260  CREATININE 0.79 0.71 0.77  --  0.76   Estimated Creatinine Clearance: 84.4 ml/min (by C-G formula based on Cr of 0.76). No results found for this basename: VANCOTROUGH, Leodis BinetVANCOPEAK, VANCORANDOM, GENTTROUGH, GENTPEAK, GENTRANDOM, TOBRATROUGH, TOBRAPEAK, TOBRARND, AMIKACINPEAK, AMIKACINTROU, AMIKACIN,  in the last 72 hours   Microbiology: Recent Results (from the past 720 hour(s))  CSF CULTURE     Status: None   Collection Time    04/16/14  9:35 AM      Result Value Ref Range Status   Gram Stain No WBC Seen   Final   Gram Stain No Organisms Seen   Final   Organism ID, Bacteria NO GROWTH 3 DAYS   Final  SURGICAL PCR SCREEN     Status: None   Collection Time    04/20/14  2:47 AM      Result Value Ref Range Status   MRSA, PCR NEGATIVE  NEGATIVE Final   Staphylococcus aureus NEGATIVE  NEGATIVE Final   Comment:            The Xpert SA Assay (FDA     approved for NASAL specimens     in patients over 57 years of age),     is one component of     a comprehensive surveillance     program.  Test performance has     been validated by [facility] for patients greater     than or equal to 825 year old.     It is  not intended     to diagnose infection nor to     guide or monitor treatment.  CULTURE, BLOOD (ROUTINE X 2)     Status: None   Collection Time    04/24/14 11:20 AM      Result Value Ref Range Status   Specimen Description BLOOD LEFT ARM   Final   Special Requests BOTTLES DRAWN AEROBIC AND ANAEROBIC 5CC EACH   Final   Culture  Setup Time     Final   Value: 04/24/2014 13:44     Performed at [facility]   Culture     Final   Value:        BLOOD CULTURE RECEIVED NO GROWTH TO DATE CULTURE WILL BE HELD FOR 5 DAYS BEFORE ISSUING A FINAL NEGATIVE REPORT     Performed at [facility]   Report Status PENDING   Incomplete  CULTURE, BLOOD (ROUTINE X 2)     Status: None   Collection Time    04/24/14 11:25 AM      Result Value Ref Range Status   Specimen Description BLOOD LEFT  HAND   Final   Special Requests BOTTLES DRAWN AEROBIC ONLY 4CC   Final   Culture  Setup Time     Final   Value: 04/24/2014 13:44     Performed at [facility]   Culture     Final   Value:        BLOOD CULTURE RECEIVED NO GROWTH TO DATE CULTURE WILL BE HELD FOR 5 DAYS BEFORE ISSUING A FINAL NEGATIVE REPORT     Performed at [facility]   Report Status PENDING   Incomplete    Anti-infectives   Start     Dose/Rate Route Frequency Ordered Stop   04/21/14 1400  piperacillin-tazobactam (ZOSYN) IVPB 3.375 g     3.375 g 12.5 mL/hr over 240 Minutes Intravenous 3 times per day 04/21/14 0235     04/21/14 1200  ceFAZolin (ANCEF) IVPB 2 g/50 mL premix     2 g 100 mL/hr over 30 Minutes Intravenous On call to O.R. 04/21/14 1126 04/22/14 0559   04/21/14 0245  piperacillin-tazobactam (ZOSYN) IVPB 3.375 g     3.375 g 100 mL/hr over 30 Minutes Intravenous  Once 04/21/14 0235 04/21/14 0328   04/21/14 0230  piperacillin-tazobactam (ZOSYN) IVPB 3.375 g  Status:  Discontinued     3.375 g 12.5 mL/hr over 240 Minutes Intravenous 3 times per day 04/21/14 0216 04/21/14 0235   04/19/14 1045  ciprofloxacin  (CIPRO) IVPB 400 mg     400 mg 200 mL/hr over 60 Minutes Intravenous  Once 04/19/14 1035 04/19/14 1158     Assessment: 58 yo F s/p ERCP w/ sphincterotomy 04/19/14 and subsequently developed RUQ/epigastric pain and elevated lipase a few hours later. CT showed perforation of the duodenum and/or distal common bile duct.  Pharmacy was consulted 7/14 to begin Zosyn for suspected intra-abdominal infection. Pharmacy consulted today to add vancomycin given patient's temperature spike.    7/14 >> Zosyn >> 7/20 >> vanc >>  Tmax: 101 WBCs: 12.5 (trending up) Renal function remains stable.   7/17 blood: ngtd (though obtained after patient was started on antibiotics)  Goal of Therapy:  Vancomycin trough level 15-20  Abx dose/schedule appropriate for renal function, infection eradication  Plan:   Begin vancomycin 1000 mg IV q 8 hours  Continue Zosyn 3.375gm q8hr- 4 hr infusion  Obtain vancomycin trough at steady state as necessary  Monitor renal function, cultures, fever trend, clinical progression  Jamse Arn, PharmD Clinical Pharmacist PGY2 Oncology Resident Pager: [phone] 04/27/2014 10:06 AM

## 2014-04-27 NOTE — Progress Notes (Signed)
PARENTERAL NUTRITION CONSULT NOTE - Follow Up  Pharmacy Consult for TPN Indication: Duodenal perforation post ERCP  Allergies  Allergen Reactions  . Codeine Nausea Only   Patient Measurements: Height: 5\' 6"  (167.6 cm) Weight: 183 lb 12.8 oz (83.371 kg) IBW/kg (Calculated) : 59.3 Adjusted Body Weight: 66kg  Vital Signs: Temp: 98.8 F (37.1 C) (07/20 0609) Temp src: Oral (07/20 0609) BP: 126/61 mmHg (07/20 0609) Pulse Rate: 98 (07/20 0609) Intake/Output from previous day: 07/19 0701 - 07/20 0700 In: 1070 [I.V.:40; IV Piggyback:550; TPN:480] Out: 2900 [Urine:2900] Intake/Output from this shift:    Labs:  Recent Labs  04/25/14 0450 04/26/14 0850 04/27/14 0615  WBC 7.7 10.4 12.5*  HGB 10.4* 11.3* 10.5*  HCT 30.0* 32.5* 30.9*  PLT 194 228 260    Recent Labs  04/25/14 0450 04/25/14 0452 04/25/14 1405 04/26/14 0630 04/27/14 0615  NA 136*  --  136* 137 138  K 3.0*  --  3.5* 3.2* 3.3*  CL 95*  --  97 95* 95*  CO2 31  --  30 33* 33*  GLUCOSE 124*  --  127* 128* 122*  BUN 10  --  10 12 14   CREATININE 0.79  --  0.71 0.77 0.76  CALCIUM 7.9*  --  8.1* 8.1* 8.4  MG  --  1.8  --  1.9 2.1  PHOS 3.3  --   --  3.6 4.0  PROT  --   --   --   --  5.8*  ALBUMIN  --   --   --   --  2.0*  AST  --   --   --   --  21  ALT  --   --   --   --  21  ALKPHOS  --   --   --   --  72  BILITOT  --   --   --   --  0.6  TRIG  --   --   --   --  90   Estimated Creatinine Clearance: 84.4 ml/min (by C-G formula based on Cr of 0.76).    Recent Labs  04/26/14 2341 04/27/14 0353 04/27/14 0739  GLUCAP 148* 141* 109*   Medications:  Scheduled:  . antiseptic oral rinse  15 mL Mouth Rinse q12n4p  . chlorhexidine  15 mL Mouth Rinse BID  . insulin aspart  0-15 Units Subcutaneous 6 times per day  . morphine   Intravenous 6 times per day  . pantoprazole (PROTONIX) IV  40 mg Intravenous Q12H  . piperacillin-tazobactam (ZOSYN)  IV  3.375 g Intravenous 3 times per day  . potassium  chloride  10 mEq Intravenous Q1 Hr x 5   Infusions:  . sodium chloride 10 mL/hr at 04/23/14 1430  . Marland Kitchen.TPN (CLINIMIX-E) Adult 80 mL/hr at 04/26/14 1721   And  . fat emulsion 250 mL (04/26/14 1721)   Insulin Requirements in the past 24 hours:  13 units Novolog SSI/24h  Current Nutrition:  Diet: NPO Clinimix E 5/15 at 80 ml/hr Lipids 20% at 10 ml/hr  [facility]  Assessment: 58yo F s/pERCP w/ sphincterotomy 04/19/14, developed RUQ and epigastric pain and elevated lipase a few hours later. Pancreatitis was suspected but lipase resolved, and a CT showed perforation of the distal CBP at duodenum. Patient has remained non-toxic appearing so treating medically for now. Pharmacy has been asked to manage TPN to allow bowel rest. She was eating normally prior to this admission.  Glucose: Within goal of 150mg /dl but requiring insulin  Electrolytes: K = 3.3 (orders to replace), Phosphorus 4.0, Corr Ca 10  Renal: SCr WNL, I/O = - 1.8L  LFTs: improved to wnl. Lipase wnl.  TGs: 90  Prealbumin: 6.0 (as of 7/16)  Concern for fluid retention, IV rate decreased and Lasix 7/16 and 7/17 with good diuresis  Nutritional Goals:  RD recs: Kcal 1800-2000, Protein 90-105 gm, Fluid requirement >/= 1800 ml all per 24 hr Clinimix E 5/15 at a goal rate of 32ml/hr + 20% fat emulsion at 73ml/hr to provide: 96g/day protein, 1843Kcal/day.  TPN Access: PICC placed  TPN day#: 6  Plan: At 1800 today:  Continue Clinimix E 5/15 at goal 41ml/hr.  20% fat emulsion at 34ml/hr.  TNA to contain standard multivitamins and trace elements daily  NS at 10 m/hr  Novolog SSI moderate q4h, likely decrease CBG to q6h when closer to goal range  TNA lab panels on Mondays & Thursdays.  Jamse Arn, PharmD Clinical Pharmacist-Resident Pager: [phone] 04/27/2014 8:56 AM

## 2014-04-27 NOTE — Progress Notes (Signed)
[facility] PROGRESS NOTE  AIRANNA PARTIN QMV:[id number] DOB: 07-28-2006 DOA: 04/17/2014 PCP: Neldon Labella, MD  Assessment/Plan: #1 post ERCP duodenal perforation with retroperitoneal perforation Patient currently afebrile. No peritoneal signs. LFTs trending down.  White count increasing back up and now at 12.5. Continue empiric IV Zosyn, bowel rest, IV fluids, pain management. GI and general surgery following and appreciate input and recommendations. Will check UA with c and s, CXR.  Repeat CT abd and pelvis with less contrast extravasation and perforation seems to be walling off. Will broaden antibiotic coverage and start IV vancomycin. Records have been sent to [facility] for review per patient request and GI managing it. Clinical improvement. Patient requesting transfer to [facility].  #2 choledocholithiasis/acute pancreatitis Status post ERCP with stone removal. Patient with acute pancreatitis status post ERCP. Lipase levels trending down. Continue bowel rest, IV fluids, pain management, supportive care. GI  And general surgery following.  #3 acute renal failure Likely secondary to prerenal azotemia. Resolved with hydration. Follow.  #4 fever Likely secondary to problem #1. Blood cultures x2 pending.  Repeat CT abdomen and pelvis with less contrast extravasation and perforation appears to be walling off. Patient still with low grade fevers. Will check UA with c and s, check CXR. Broaden antibiotics with IV Vancomycin.  #5 prophylaxis SCDs for DVT prophylaxis.  Code Status: Full Family Communication: Updated patient and family at bedside. Disposition Plan: Probable transfer to [facility] when bed available.   Consultants:  GI: Dr Dulce Sellar 04/18/14  CCS: Dr Johna Sheriff 04/18/14  Procedures:  ERCP with sphincterectomy/papillotomy and ERCP with removal of calculus/calculi 04/19/2014 per Dr. Dulce Sellar  CT abdomen and pelvis 04/20/2014, 04/24/2014  Abdominal ultrasound 04/17/2014  Antibiotics:  IV  Zosyn 04/21/14>>  IV Vancomycin 04/27/14  HPI/Subjective: Patient states less diffuse abdominal pain. No bowel movement. Patient with flatus. Patient requesting transfer to [facility].  Objective: Filed Vitals:   04/27/14 0800  BP:   Pulse:   Temp:   Resp: 13    Intake/Output Summary (Last 24 hours) at 04/27/14 1006 Last data filed at 04/27/14 0610  Gross per 24 hour  Intake    870 ml  Output   2900 ml  Net  -2030 ml   Filed Weights   04/25/14 1737 04/26/14 0456 04/27/14 0609  Weight: 85.775 kg (189 lb 1.6 oz) 81.693 kg (180 lb 1.6 oz) 83.371 kg (183 lb 12.8 oz)    Exam:   General:  NAD  Cardiovascular: RRR  Respiratory: CTAB  Abdomen: Soft/less TTP RUQ and lower abdomen. /ND/+BS  Musculoskeletal: No c/c/e  Data Reviewed: Basic Metabolic Panel:  Recent Labs Lab 04/22/14 0428 04/23/14 0525 04/24/14 0540 04/25/14 0450 04/25/14 0452 04/25/14 1405 04/26/14 0630 04/27/14 0615  NA 139 139 137 136*  --  136* 137 138  K 4.3 3.9 3.4* 3.0*  --  3.5* 3.2* 3.3*  CL 105 105 99 95*  --  97 95* 95*  CO2 23 25 29 31   --  30 33* 33*  GLUCOSE 131* 148* 119* 124*  --  127* 128* 122*  BUN 12 10 12 10   --  10 12 14   CREATININE 0.93 0.68 0.79 0.79  --  0.71 0.77 0.76  CALCIUM 7.9* 8.0* 8.1* 7.9*  --  8.1* 8.1* 8.4  MG 1.6 2.3  --   --  1.8  --  1.9 2.1  PHOS  --  1.3* 2.2* 3.3  --   --  3.6 4.0   Liver Function Tests:  Recent Labs  Lab 04/21/14 0413 04/22/14 0428 04/23/14 0525 04/24/14 0540 04/27/14 0615  AST 31 21 17 17 21   ALT 85* 52* 37* 27 21  ALKPHOS 71 53 49 61 72  BILITOT 1.4* 0.8 0.7 0.7 0.6  PROT 5.4* 5.3* 5.5* 5.5* 5.8*  ALBUMIN 2.5* 2.0* 2.1* 2.0* 2.0*    Recent Labs Lab 04/20/14 1905 04/21/14 0413 04/22/14 0428 04/24/14 0540 04/27/14 0615  LIPASE 66* 50 32 68* 55   No results found for this basename: AMMONIA,  in the last 168 hours CBC:  Recent Labs Lab 04/21/14 1330 04/22/14 0428 04/23/14 0525 04/24/14 0540 04/25/14 0450 04/26/14 0850  04/27/14 0615  WBC  --  8.4 8.9 8.4 7.7 10.4 12.5*  NEUTROABS 9.5* 7.0 7.1  --   --  7.8* 9.3*  HGB  --  13.1 12.6 11.1* 10.4* 11.3* 10.5*  HCT  --  38.1 36.6 31.3* 30.0* 32.5* 30.9*  MCV  --  91.1 90.8 90.5 90.1 90.8 91.7  PLT  --  175 221 205 194 228 260   Cardiac Enzymes: No results found for this basename: CKTOTAL, CKMB, CKMBINDEX, TROPONINI,  in the last 168 hours BNP (last 3 results) No results found for this basename: PROBNP,  in the last 8760 hours CBG:  Recent Labs Lab 04/26/14 1629 04/26/14 1947 04/26/14 2341 04/27/14 0353 04/27/14 0739  GLUCAP 105* 159* 148* 141* 109*    Recent Results (from the past 240 hour(s))  SURGICAL PCR SCREEN     Status: None   Collection Time    04/20/14  2:47 AM      Result Value Ref Range Status   MRSA, PCR NEGATIVE  NEGATIVE Final   Staphylococcus aureus NEGATIVE  NEGATIVE Final   Comment:            The Xpert SA Assay (FDA     approved for NASAL specimens     in patients over 57 years of age),     is one component of     a comprehensive surveillance     program.  Test performance has     been validated by [facility] for patients greater     than or equal to 71 year old.     It is not intended     to diagnose infection nor to     guide or monitor treatment.  CULTURE, BLOOD (ROUTINE X 2)     Status: None   Collection Time    04/24/14 11:20 AM      Result Value Ref Range Status   Specimen Description BLOOD LEFT ARM   Final   Special Requests BOTTLES DRAWN AEROBIC AND ANAEROBIC 5CC EACH   Final   Culture  Setup Time     Final   Value: 04/24/2014 13:44     Performed at [facility]   Culture     Final   Value:        BLOOD CULTURE RECEIVED NO GROWTH TO DATE CULTURE WILL BE HELD FOR 5 DAYS BEFORE ISSUING A FINAL NEGATIVE REPORT     Performed at [facility]   Report Status PENDING   Incomplete  CULTURE, BLOOD (ROUTINE X 2)     Status: None   Collection Time    04/24/14 11:25 AM      Result Value Ref  Range Status   Specimen Description BLOOD LEFT HAND   Final   Special Requests BOTTLES DRAWN AEROBIC ONLY 4CC   Final  Culture  Setup Time     Final   Value: 04/24/2014 13:44     Performed at [facility]   Culture     Final   Value:        BLOOD CULTURE RECEIVED NO GROWTH TO DATE CULTURE WILL BE HELD FOR 5 DAYS BEFORE ISSUING A FINAL NEGATIVE REPORT     Performed at [facility]   Report Status PENDING   Incomplete     Studies: No results found.  Scheduled Meds: . antiseptic oral rinse  15 mL Mouth Rinse q12n4p  . chlorhexidine  15 mL Mouth Rinse BID  . insulin aspart  0-15 Units Subcutaneous 6 times per day  . morphine   Intravenous 6 times per day  . pantoprazole (PROTONIX) IV  40 mg Intravenous Q12H  . piperacillin-tazobactam (ZOSYN)  IV  3.375 g Intravenous 3 times per day  . potassium chloride  10 mEq Intravenous Q1 Hr x 5   Continuous Infusions: . sodium chloride 10 mL/hr at 04/23/14 1430  . Marland Kitchen.TPN (CLINIMIX-E) Adult 80 mL/hr at 04/26/14 1721   And  . fat emulsion 250 mL (04/26/14 1721)    Principal Problem:   Retroperitoneal air: Post ERCP retroperitoneal perforation Active Problems:   Common bile duct stone   Choledocholithiasis   ARF (acute renal failure)   Fever, unspecified    Time spent: 35 MINS    [facility] CenterHOMPSON,DANIEL MD [facility] Pager [phone]. If 7PM-7AM, please contact night-coverage at [email or web address], password [email or web address] 04/27/2014, 10:06 AM  LOS: 10 days

## 2014-04-27 NOTE — Care Management Note (Signed)
    Page 1 of 1   04/27/2014     4:47:44 PM CARE MANAGEMENT NOTE 04/27/2014  Patient:  Krista Chapman, Krista Chapman   Account Number:  [id number]  Date Initiated:  04/27/2014  Documentation initiated by:  Dessa Phi  Subjective/Objective Assessment:   58 Y/O F ADMITTED W/RETROPERITONEAL PERFORATION.     Action/Plan:   FROM HOME.   Anticipated DC Date:  04/27/2014   Anticipated DC Plan:  ACUTE TO ACUTE TRANS      DC Planning Services  CM consult      Choice offered to / List presented to:             Status of service:  Completed, signed off Medicare Important Message given?   (If response is "NO", the following Medicare IM given date fields will be blank) Date Medicare IM given:   Medicare IM given by:   Date Additional Medicare IM given:   Additional Medicare IM given by:    Discharge Disposition:  ACUTE TO ACUTE TRANS  Per UR Regulation:  Reviewed for med. necessity/level of care/duration of stay  If discussed at [place] of Stay Meetings, dates discussed:    Comments:  04/27/14 Merritt Kibby RN,BSN NCM [phone] SPOKE TO PATIENT IN RM ABOUT CONCERNS-FELT HER NEEDS WERE NOT MET.PROVIDED HER WITH PATIENT EXPERIENCE TEL# SINCE SHE HAD ALREADY SPOKEN TO NSG/ADMINISTRATION/MEDICAL STAFF.UPDATED DIR-STEVE.PATIENT FOR TRANSFER TO [facility] VIA CARE LINK.

## 2014-04-27 NOTE — Progress Notes (Signed)
Patient is having temperature up to 101 and has increasing leukocytosis.  Is flushed.  No appreciable increase in abdominal pain.  We are planning to broaden antibiotic coverage.  [facility].

## 2014-04-27 NOTE — Progress Notes (Signed)
8 Days Post-Op  Subjective: She feels hot and flushed. Her abdominal pain seems stable but she if frustrated that she is not seeming to get better  Objective: Vital signs in last 24 hours: Temp:  [98.7 F (37.1 C)-101 F (38.3 C)] 98.8 F (37.1 C) (07/20 0609) Pulse Rate:  [88-106] 98 (07/20 0609) Resp:  [12-23] 13 (07/20 0800) BP: (118-126)/(61-66) 126/61 mmHg (07/20 0609) SpO2:  [94 %-100 %] 100 % (07/20 0800) FiO2 (%):  [21 %-28 %] 28 % (07/20 0800) Weight:  [183 lb 12.8 oz (83.371 kg)] 183 lb 12.8 oz (83.371 kg) (07/20 0609) Last BM Date: 04/17/14  Intake/Output from previous day: 07/19 0701 - 07/20 0700 In: 1070 [I.V.:40; IV Piggyback:550; TPN:480] Out: 2900 [Urine:2900] Intake/Output this shift:    Resp: clear to auscultation bilaterally Cardio: regular rate and rhythm GI: there is moderate diffuse tenderness. good bowel sounds  Lab Results:   Recent Labs  04/26/14 0850 04/27/14 0615  WBC 10.4 12.5*  HGB 11.3* 10.5*  HCT 32.5* 30.9*  PLT 228 260   BMET  Recent Labs  04/26/14 0630 04/27/14 0615  NA 137 138  K 3.2* 3.3*  CL 95* 95*  CO2 33* 33*  GLUCOSE 128* 122*  BUN 12 14  CREATININE 0.77 0.76  CALCIUM 8.1* 8.4   PT/INR No results found for this basename: LABPROT, INR,  in the last 72 hours ABG No results found for this basename: PHART, PCO2, PO2, HCO3,  in the last 72 hours  Studies/Results: No results found.  Anti-infectives: Anti-infectives   Start     Dose/Rate Route Frequency Ordered Stop   04/27/14 1030  vancomycin (VANCOCIN) IVPB 1000 mg/200 mL premix     1,000 mg 200 mL/hr over 60 Minutes Intravenous Every 8 hours 04/27/14 1008     04/21/14 1400  piperacillin-tazobactam (ZOSYN) IVPB 3.375 g     3.375 g 12.5 mL/hr over 240 Minutes Intravenous 3 times per day 04/21/14 0235     04/21/14 1200  ceFAZolin (ANCEF) IVPB 2 g/50 mL premix     2 g 100 mL/hr over 30 Minutes Intravenous On call to O.R. 04/21/14 1126 04/22/14 0559   04/21/14  0245  piperacillin-tazobactam (ZOSYN) IVPB 3.375 g     3.375 g 100 mL/hr over 30 Minutes Intravenous  Once 04/21/14 0235 04/21/14 0328   04/21/14 0230  piperacillin-tazobactam (ZOSYN) IVPB 3.375 g  Status:  Discontinued     3.375 g 12.5 mL/hr over 240 Minutes Intravenous 3 times per day 04/21/14 0216 04/21/14 0235   04/19/14 1045  ciprofloxacin (CIPRO) IVPB 400 mg     400 mg 200 mL/hr over 60 Minutes Intravenous  Once 04/19/14 1035 04/19/14 1158      Assessment/Plan: s/p Procedure(s): ENDOSCOPIC RETROGRADE CHOLANGIOPANCREATOGRAPHY (ERCP) (N/A) She has retroduodenal perforation of duodenum after ERCP. She ran fever of 101 last night and wbc has bumped slightly today to 12.5.  This is certainly worrisome for worsening infection. At this point I would agree with broadening antibiotic coverage. I would consider CT to see if she has developed a drainable abscess. If she continues to worsen then she may require operative intervention. All of her doctors are here to talk to her about the options for treatment and at [facility] point [facility] We will try to make those arrangements for her.  LOS: 10 days    TOTH III,Raiyah Speakman S 04/27/2014

## 2014-04-28 LAB — GLUCOSE, CAPILLARY: GLUCOSE-CAPILLARY: 131 mg/dL — AB (ref 70–99)

## 2014-04-28 LAB — URINE CULTURE
COLONY COUNT: NO GROWTH
CULTURE: NO GROWTH

## 2014-04-30 LAB — CULTURE, BLOOD (ROUTINE X 2)
Culture: NO GROWTH
Culture: NO GROWTH

## 2014-07-20 ENCOUNTER — Other Ambulatory Visit: Payer: Self-pay | Admitting: Family Medicine

## 2014-07-20 DIAGNOSIS — M79602 Pain in left arm: Secondary | ICD-10-CM

## 2014-07-27 ENCOUNTER — Ambulatory Visit
Admission: RE | Admit: 2014-07-27 | Discharge: 2014-07-27 | Disposition: A | Discharge status: Home / Self Care | Payer: BC Managed Care – PPO | Source: Ambulatory Visit | Attending: Family Medicine | Admitting: Family Medicine

## 2014-07-27 DIAGNOSIS — M79602 Pain in left arm: Secondary | ICD-10-CM

## 2014-08-06 ENCOUNTER — Other Ambulatory Visit: Payer: Self-pay

## 2014-08-06 DIAGNOSIS — Z1231 Encounter for screening mammogram for malignant neoplasm of breast: Secondary | ICD-10-CM

## 2014-09-07 ENCOUNTER — Ambulatory Visit: Payer: BC Managed Care – PPO

## 2014-09-21 ENCOUNTER — Ambulatory Visit: Payer: BC Managed Care – PPO

## 2014-10-06 ENCOUNTER — Ambulatory Visit
Admission: RE | Admit: 2014-10-06 | Discharge: 2014-10-06 | Disposition: A | Discharge status: Home / Self Care | Payer: BC Managed Care – PPO | Source: Ambulatory Visit

## 2014-10-06 DIAGNOSIS — Z1231 Encounter for screening mammogram for malignant neoplasm of breast: Secondary | ICD-10-CM

## 2014-10-07 ENCOUNTER — Ambulatory Visit: Payer: BC Managed Care – PPO | Admitting: Physical Therapy

## 2014-10-22 ENCOUNTER — Encounter (HOSPITAL_COMMUNITY): Payer: Self-pay | Admitting: General Surgery

## 2015-02-11 ENCOUNTER — Other Ambulatory Visit: Payer: Self-pay | Admitting: Family Medicine

## 2015-02-11 DIAGNOSIS — R1010 Upper abdominal pain, unspecified: Secondary | ICD-10-CM

## 2015-02-12 ENCOUNTER — Ambulatory Visit
Admission: RE | Admit: 2015-02-12 | Discharge: 2015-02-12 | Disposition: A | Discharge status: Home / Self Care | Payer: 59 | Source: Ambulatory Visit | Attending: Family Medicine | Admitting: Family Medicine

## 2015-02-12 DIAGNOSIS — R1010 Upper abdominal pain, unspecified: Secondary | ICD-10-CM

## 2015-02-12 MED ORDER — IOPAMIDOL (ISOVUE-300) INJECTION 61%
100.0000 mL | Freq: Once | INTRAVENOUS | Status: AC | PRN
  Administered 2015-02-12: 100 mL via INTRAVENOUS

## 2015-03-12 ENCOUNTER — Telehealth: Payer: Self-pay | Admitting: Cardiovascular Disease

## 2015-03-15 ENCOUNTER — Telehealth: Payer: Self-pay | Admitting: Cardiovascular Disease

## 2015-03-15 NOTE — Telephone Encounter (Signed)
Received records from [facility] for appointment on 04/21/15 with Dr Allyson Sabal.  Records given to [facility] (medical records) for Dr Hazle Coca schedule on 04/21/15. lp

## 2015-03-16 NOTE — Telephone Encounter (Signed)
Close encounter 

## 2015-04-21 ENCOUNTER — Ambulatory Visit: Payer: 59 | Admitting: Cardiovascular Disease

## 2015-04-28 ENCOUNTER — Telehealth: Payer: Self-pay | Admitting: Cardiovascular Disease

## 2015-04-28 NOTE — Telephone Encounter (Signed)
Closed encounter °

## 2015-05-07 ENCOUNTER — Encounter (HOSPITAL_BASED_OUTPATIENT_CLINIC_OR_DEPARTMENT_OTHER): Payer: Self-pay

## 2015-05-07 ENCOUNTER — Emergency Department (HOSPITAL_BASED_OUTPATIENT_CLINIC_OR_DEPARTMENT_OTHER)
Admission: EM | Admit: 2015-05-07 | Discharge: 2015-05-07 | Disposition: A | Discharge status: Home / Self Care | Payer: 59 | Attending: Emergency Medicine | Admitting: Emergency Medicine

## 2015-05-07 ENCOUNTER — Emergency Department (HOSPITAL_BASED_OUTPATIENT_CLINIC_OR_DEPARTMENT_OTHER): Payer: 59

## 2015-05-07 DIAGNOSIS — W25XXXA Contact with sharp glass, initial encounter: Secondary | ICD-10-CM | POA: Diagnosis not present

## 2015-05-07 DIAGNOSIS — Y998 Other external cause status: Secondary | ICD-10-CM | POA: Insufficient documentation

## 2015-05-07 DIAGNOSIS — Z87891 Personal history of nicotine dependence: Secondary | ICD-10-CM | POA: Insufficient documentation

## 2015-05-07 DIAGNOSIS — Z23 Encounter for immunization: Secondary | ICD-10-CM | POA: Insufficient documentation

## 2015-05-07 DIAGNOSIS — Z8639 Personal history of other endocrine, nutritional and metabolic disease: Secondary | ICD-10-CM | POA: Diagnosis not present

## 2015-05-07 DIAGNOSIS — K5900 Constipation, unspecified: Secondary | ICD-10-CM | POA: Diagnosis not present

## 2015-05-07 DIAGNOSIS — S41111A Laceration without foreign body of right upper arm, initial encounter: Secondary | ICD-10-CM

## 2015-05-07 DIAGNOSIS — Y9289 Other specified places as the place of occurrence of the external cause: Secondary | ICD-10-CM | POA: Diagnosis not present

## 2015-05-07 DIAGNOSIS — Z79899 Other long term (current) drug therapy: Secondary | ICD-10-CM | POA: Diagnosis not present

## 2015-05-07 DIAGNOSIS — S51811A Laceration without foreign body of right forearm, initial encounter: Secondary | ICD-10-CM | POA: Diagnosis not present

## 2015-05-07 DIAGNOSIS — Y93G1 Activity, food preparation and clean up: Secondary | ICD-10-CM | POA: Diagnosis not present

## 2015-05-07 MED ORDER — ACETAMINOPHEN 500 MG PO TABS
1000.0000 mg | ORAL_TABLET | Freq: Once | ORAL | Status: AC
  Administered 2015-05-07: 1000 mg via ORAL
  Filled 2015-05-07: qty 2

## 2015-05-07 MED ORDER — LIDOCAINE-EPINEPHRINE 1 %-1:100000 IJ SOLN
10.0000 mL | Freq: Once | INTRAMUSCULAR | Status: AC
  Administered 2015-05-07: 10 mL via INTRADERMAL
  Filled 2015-05-07: qty 1

## 2015-05-07 MED ORDER — IBUPROFEN 400 MG PO TABS
400.0000 mg | ORAL_TABLET | Freq: Once | ORAL | Status: AC
  Administered 2015-05-07: 400 mg via ORAL
  Filled 2015-05-07: qty 1

## 2015-05-07 MED ORDER — TETANUS-DIPHTH-ACELL PERTUSSIS 5-2.5-18.5 LF-MCG/0.5 IM SUSP
0.5000 mL | Freq: Once | INTRAMUSCULAR | Status: AC
  Administered 2015-05-07: 0.5 mL via INTRAMUSCULAR
  Filled 2015-05-07: qty 0.5

## 2015-05-07 NOTE — Discharge Instructions (Signed)

## 2015-05-07 NOTE — ED Notes (Signed)
Pt lacerated right forearm near elbow with glass while she was washing it.

## 2015-05-07 NOTE — ED Provider Notes (Signed)
CSN: [id number]     Arrival date & time 05/07/15  1245 History   First MD Initiated Contact with Patient 05/07/15 1309     Chief Complaint  Patient presents with  . Extremity Laceration     (Consider location/radiation/quality/duration/timing/severity/associated sxs/prior Treatment) Patient is a 59 y.o. female presenting with skin laceration. The history is provided by the patient.  Laceration Location:  Shoulder/arm Shoulder/arm laceration location:  R forearm Length (cm):  3cm Depth:  Through dermis Quality: jagged   Bleeding: controlled   Time since incident:  1 hour Laceration mechanism:  Broken glass Pain details:    Quality:  Sharp   Severity:  Mild   Timing:  Constant   Progression:  Unchanged Foreign body present:  Unable to specify Relieved by:  Nothing Worsened by:  Nothing tried Ineffective treatments:  None tried Tetanus status:  Unknown   59 yo F cleaning her dishes today broke a glass and then dropped to cut the right arm just distal to the elbow. Bleeding controlled. Denies numbness or tingling in that extremity. Unknown last tetanus.  Past Medical History  Diagnosis Date  . Gallstones   . Palpitation   . Constipation   . Hypothyroidism    Past Surgical History  Procedure Laterality Date  . Ankle surgery Left 2004  . Colonoscopy    . Ercp N/A 04/19/2014    Procedure: ENDOSCOPIC RETROGRADE CHOLANGIOPANCREATOGRAPHY (ERCP);  Surgeon: Willis Modena, MD;  Location: [facility];  Service: Endoscopy;  Laterality: N/A;  . Cholecystectomy     Family History  Problem Relation Age of Onset  . Emphysema Father   . Diabetes Father   . Heart disease Mother   . Diabetes Mother   . Diabetes Sister   . Diabetes Brother    History  Substance Use Topics  . Smoking status: Former Smoker -- 0.10 packs/day for 3 years    Types: Cigarettes    Quit date: 10/09/1988  . Smokeless tobacco: Never Used  . Alcohol Use: 1.2 oz/week    2 Standard drinks or equivalent per  week     Comment: socially   OB History    No data available     Review of Systems  Constitutional: Negative for fever and chills.  HENT: Negative for congestion and rhinorrhea.   Eyes: Negative for redness and visual disturbance.  Respiratory: Negative for shortness of breath and wheezing.   Cardiovascular: Negative for chest pain and palpitations.  Gastrointestinal: Negative for nausea and vomiting.  Genitourinary: Negative for dysuria and urgency.  Musculoskeletal: Negative for myalgias, back pain and arthralgias.  Skin: Positive for wound. Negative for color change, pallor and rash.  Neurological: Negative for dizziness and headaches.      Allergies  Xarelto and Codeine  Home Medications   Prior to Admission medications   Medication Sig Start Date End Date Taking? Authorizing Provider  ALPRAZolam Prudy Feeler) 0.25 MG tablet Take 0.25 mg by mouth at bedtime as needed for anxiety.    Historical Provider, MD  amLODipine (NORVASC) 2.5 MG tablet TAKE 1 TABLET (2.5 MG TOTAL) BY MOUTH DAILY. <must make appointment at Dr. Hazle Coca office for future refills> 01/06/14   Runell Gess, MD  Aspirin-Acetaminophen (GOODYS BODY PAIN PO) Take 2 each by mouth daily as needed (body aches).    Historical Provider, MD  bisacodyl (DULCOLAX) 10 MG suppository Place 1 suppository (10 mg total) rectally daily as needed for moderate constipation. 04/27/14   Rodolph Bong, MD  Calcium Carbonate-Vitamin D (CALCIUM  600+D HIGH POTENCY) 600-400 MG-UNIT per tablet Take 1 tablet by mouth daily.    Historical Provider, MD  cholecalciferol (VITAMIN D) 1000 UNITS tablet Take 10,000 Units by mouth daily.     Historical Provider, MD  fluticasone (FLONASE) 50 MCG/ACT nasal spray Place 1 spray into both nostrils daily as needed.     Historical Provider, MD  hydrocortisone cream 1 % Apply topically as needed for itching. 04/27/14   Rodolph Bong, MD  MILK THISTLE EXTRACT PO Take 3 tablets by mouth daily.     Historical Provider, MD  Omega-3 Fatty Acids (FISH OIL PO) Take by mouth.    Historical Provider, MD  piperacillin-tazobactam (ZOSYN) 3.375 GM/50ML IVPB Inject 50 mLs (3.375 g total) into the vein every 8 (eight) hours. 04/27/14   Rodolph Bong, MD  vancomycin (VANCOCIN) 1 GM/200ML SOLN Inject 200 mLs (1,000 mg total) into the vein every 8 (eight) hours. 04/27/14   Rodolph Bong, MD   BP 126/72 mmHg  Pulse 57  Temp(Src) 98.3 F (36.8 C)  Resp 18  Ht 5\' 6"  (1.676 m)  Wt 150 lb (68.04 kg)  BMI 24.22 kg/m2  SpO2 100% Physical Exam  Constitutional: She is oriented to person, place, and time. She appears well-developed and well-nourished. No distress.  HENT:  Head: Normocephalic and atraumatic.  Eyes: EOM are normal. Pupils are equal, round, and reactive to light.  Neck: Normal range of motion. Neck supple.  Cardiovascular: Normal rate and regular rhythm.  Exam reveals no gallop and no friction rub.   No murmur heard. Pulmonary/Chest: Effort normal. She has no wheezes. She has no rales.  Abdominal: Soft. She exhibits no distension. There is no tenderness.  Musculoskeletal: She exhibits no edema or tenderness.       Arms: Neurological: She is alert and oriented to person, place, and time.  Skin: Skin is warm and dry. She is not diaphoretic.  Psychiatric: She has a normal mood and affect. Her behavior is normal.    ED Course  LACERATION REPAIR Date/Time: 05/07/2015 9:06 PM Performed by: Adela Lank Adyn Serna Authorized by: Melene Plan Consent: Verbal consent obtained. Risks and benefits: risks, benefits and alternatives were discussed Consent given by: patient Required items: required blood products, implants, devices, and special equipment available Time out: Immediately prior to procedure a "time out" was called to verify the correct patient, procedure, equipment, support staff and site/side marked as required. Body area: upper extremity Location details: left lower arm Laceration  length: 3 cm Foreign bodies: no foreign bodies Tendon involvement: none Nerve involvement: none Vascular damage: no Anesthesia: local infiltration Local anesthetic: lidocaine 1% with epinephrine Anesthetic total: 5 ml Patient sedated: no Preparation: Patient was prepped and draped in the usual sterile fashion. Irrigation solution: saline Irrigation method: jet lavage Amount of cleaning: extensive Debridement: none Degree of undermining: none Skin closure: 4-0 nylon Number of sutures: 3 Technique: simple Approximation: close Approximation difficulty: simple Dressing: 4x4 sterile gauze and antibiotic ointment Patient tolerance: Patient tolerated the procedure well with no immediate complications   (including critical care time) Labs Review Labs Reviewed - No data to display  Imaging Review Dg Forearm Right  05/07/2015   CLINICAL DATA:  59 year old female with right forearm laceration  EXAM: RIGHT FOREARM - 2 VIEW  COMPARISON:  None.  FINDINGS: There is no evidence of fracture or other focal bone lesions. Soft tissues are unremarkable. No radiopaque retained foreign body identified.  IMPRESSION: Negative.   Electronically Signed   By: Vilma Prader  Archer Asa M.D.   On: 05/07/2015 13:33     EKG Interpretation None      MDM   Final diagnoses:  Laceration of upper extremity, right, initial encounter    59 yo F with right upper extremity laceration. Repair at bedside.  Xray without Fb.  Reparative bedside patient will follow up with her PCP in 10 days for removal of sutures.    I have discussed the diagnosis/risks/treatment options with the patient and believe the pt to be eligible for discharge home to follow-up with PCP. We also discussed returning to the ED immediately if new or worsening sx occur. We discussed the sx which are most concerning (e.g., redness, swelling, discharge) that necessitate immediate return. Medications administered to the patient during their visit and any  new prescriptions provided to the patient are listed below.  Medications given during this visit Medications  Tdap (BOOSTRIX) injection 0.5 mL (0.5 mLs Intramuscular Given 05/07/15 1346)  ibuprofen (ADVIL,MOTRIN) tablet 400 mg (400 mg Oral Given 05/07/15 1345)  acetaminophen (TYLENOL) tablet 1,000 mg (1,000 mg Oral Given 05/07/15 1346)  lidocaine-EPINEPHrine (XYLOCAINE W/EPI) 1 %-1:100000 (with pres) injection 10 mL (10 mLs Intradermal Given 05/07/15 1411)    Discharge Medication List as of 05/07/2015  2:07 PM       The patient appears reasonably screen and/or stabilized for discharge and I doubt any other medical condition or other [facility] requiring further screening, evaluation, or treatment in the ED at this time prior to discharge.     Melene Plan, DO 05/07/15 2107

## 2015-06-10 ENCOUNTER — Encounter: Payer: Self-pay | Admitting: Cardiovascular Disease

## 2015-09-08 ENCOUNTER — Other Ambulatory Visit: Payer: Self-pay

## 2015-09-08 DIAGNOSIS — Z1231 Encounter for screening mammogram for malignant neoplasm of breast: Secondary | ICD-10-CM

## 2015-09-27 IMAGING — RF DG ERCP WO/W SPHINCTEROTOMY
1 series · 6 of 6 positions shown · non-contrast
Comparison: Abdominal ultrasound - 04/17/2014; CT abdomen pelvis-
06/09/2010

CLINICAL DATA: ERCP with sphincterotomy for common bile duct
stones.

EXAM:
ERCP
TECHNIQUE: Multiple spot images obtained with the fluoroscopic device and
submitted for interpretation post-procedure.
FLUOROSCOPY TIME:  2 min, 43 second

[Series 1: run · 6 of 6 slices shown]
[im 1/6]
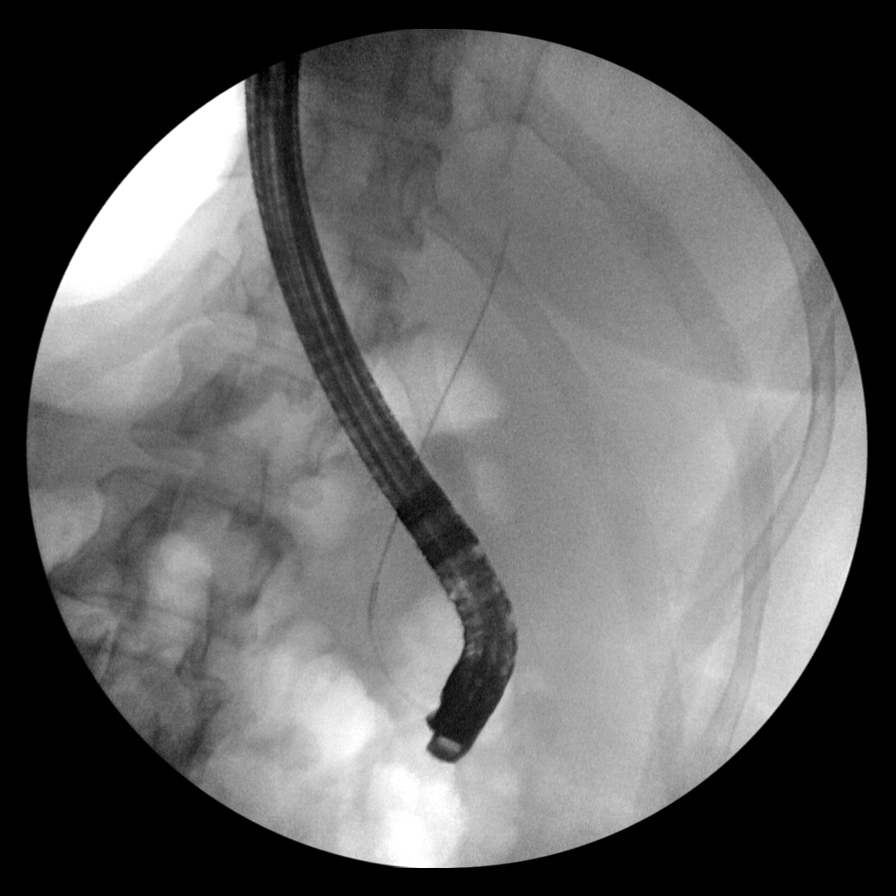
[im 2/6]
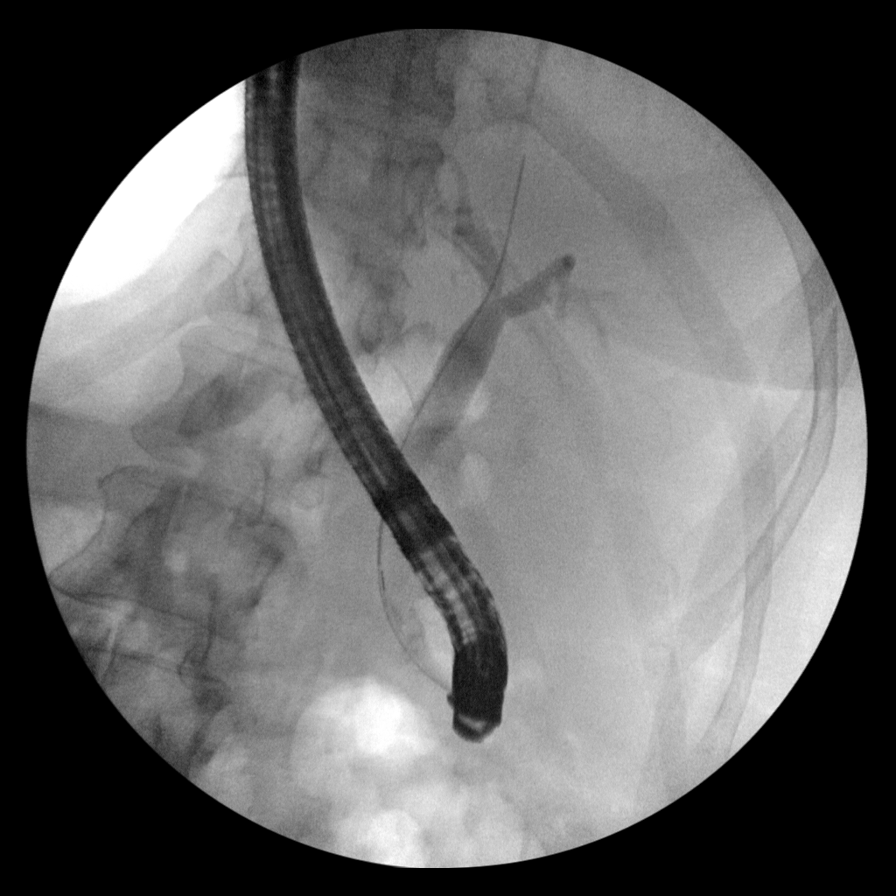
[im 3/6]
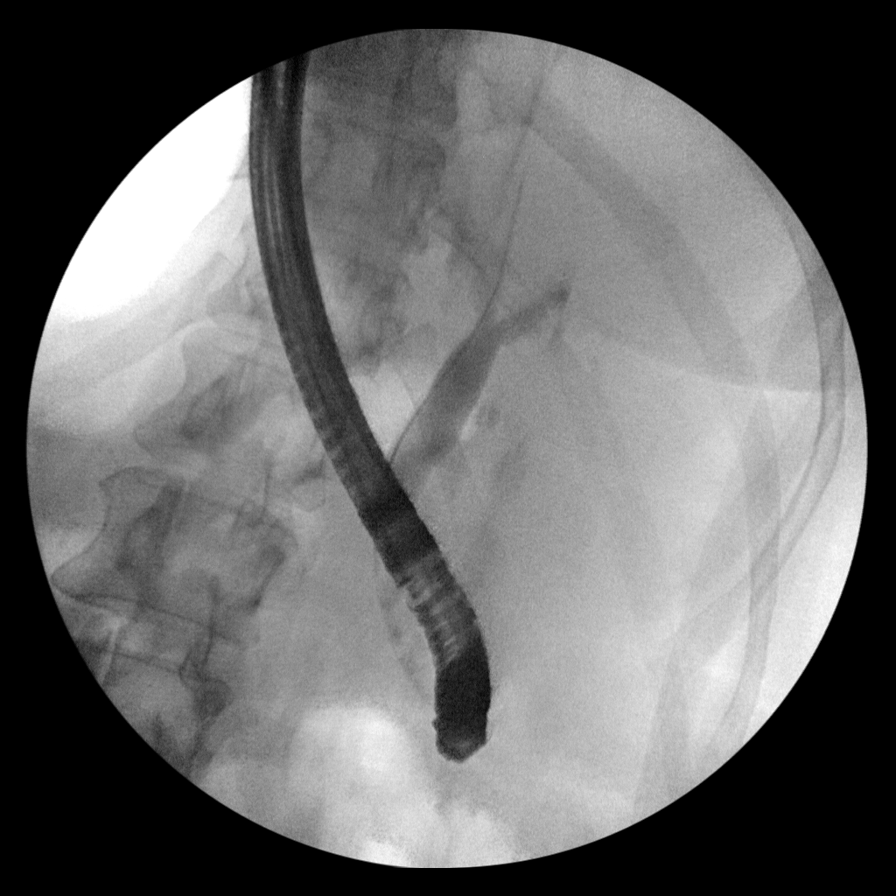
[im 4/6]
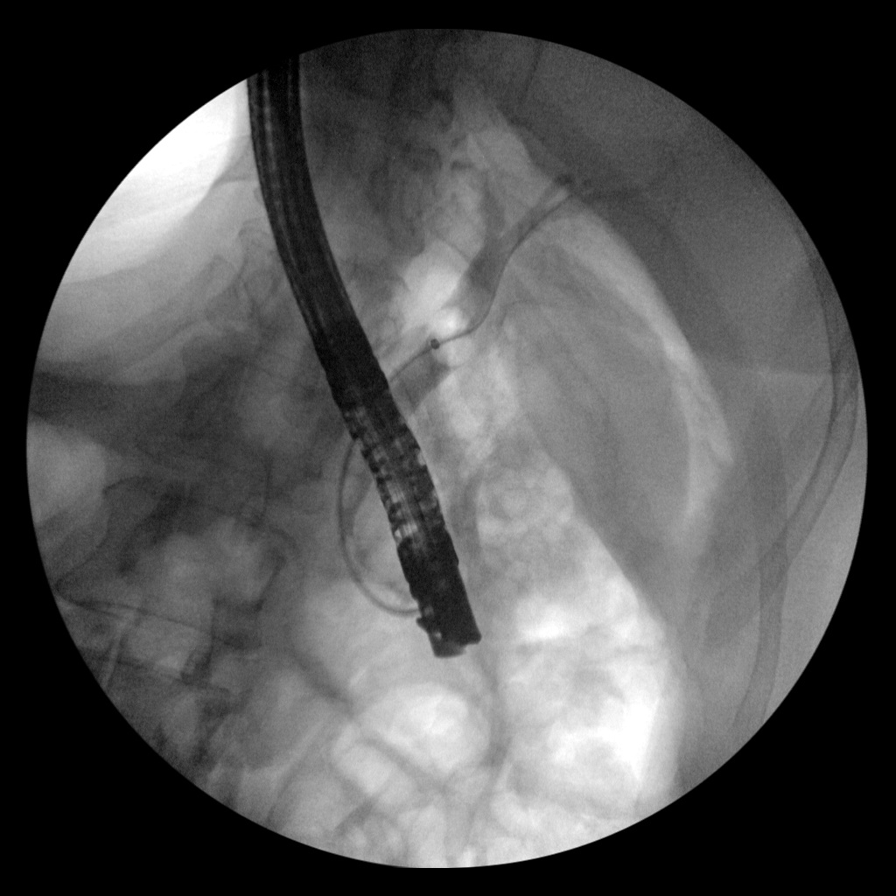
[im 5/6]
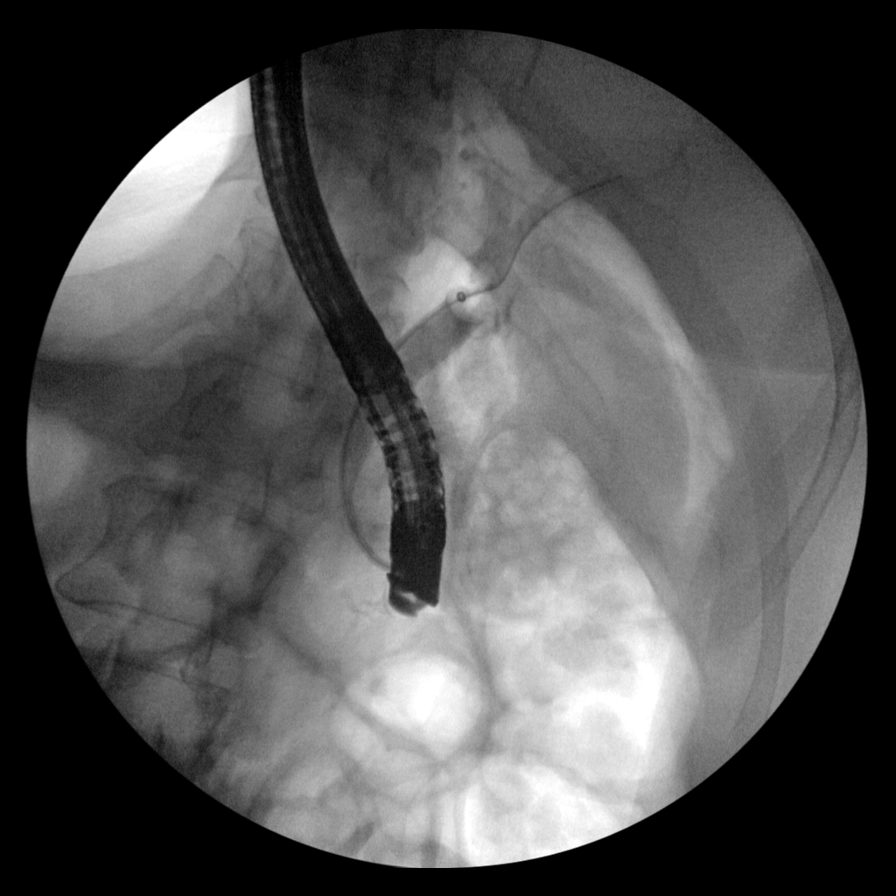
[im 6/6]
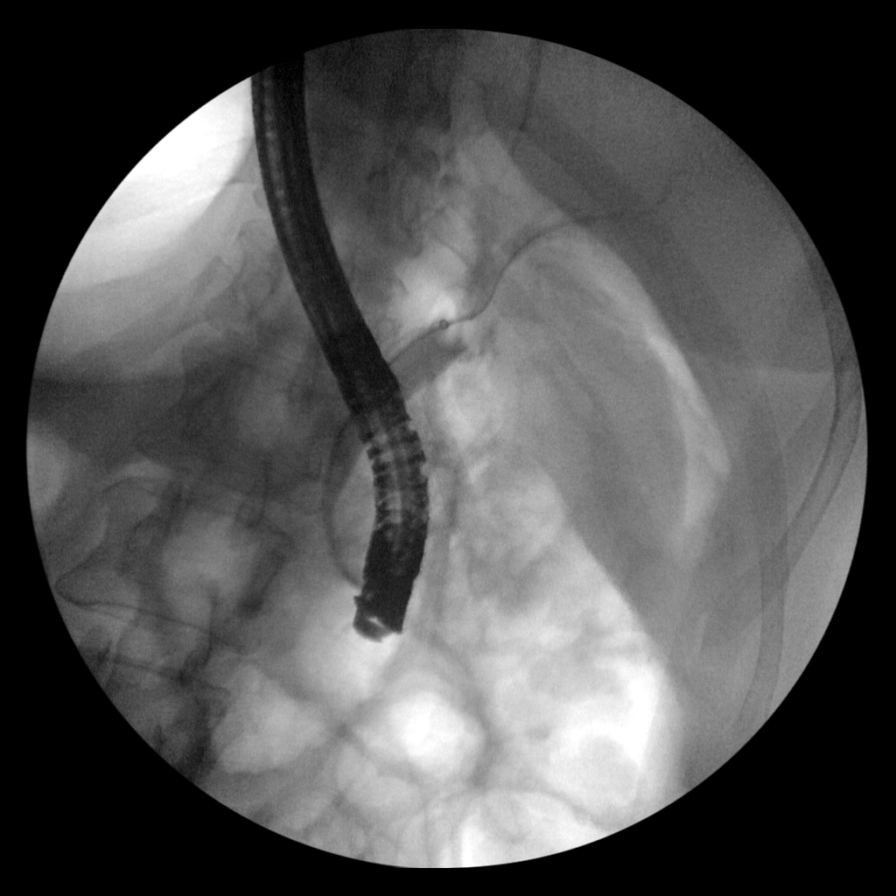

[6 of 6 positions shown; findings below may reference images not displayed]

FINDINGS: Six spot intraoperative fluoroscopic images during ERCP are provided
for review.

Initial image demonstrates an ERCP probe overlying the right upper
abdominal quadrant. There is subsequent selective cannulation
opacification of the common bile duct.

There are 2 discrete filling defects within the distal aspect of the
CBD. There is instillation of a biliary sphincterotomy balloon
within in the central aspect of the common bile duct.

Completion image demonstrates apparent retraction of the CBD stones.

There is minimal opacification of the gallbladder with multiple
filling defects compatible with the cholelithiasis demonstrated on
preceding abdominal ultrasound.

There is minimal opacification of the central aspect of the
intrahepatic biliary tree which appears nondilated. There is no
definitive opacification of the pancreatic duct.
IMPRESSION: 1. ERCP with stone retraction, biliary sweeping and presumed
sphincterotomy as above.
2. Cholelithiasis as demonstrated on preceding abdominal ultrasound.
These images were submitted for radiologic interpretation only.
Please see the procedural report for the amount of contrast and the
fluoroscopy time utilized.

## 2015-10-18 ENCOUNTER — Ambulatory Visit: Payer: 59

## 2015-10-26 ENCOUNTER — Other Ambulatory Visit: Payer: Self-pay | Admitting: Family Medicine

## 2015-10-26 ENCOUNTER — Other Ambulatory Visit (HOSPITAL_COMMUNITY)
Admission: RE | Admit: 2015-10-26 | Discharge: 2015-10-26 | Disposition: A | Discharge status: Home / Self Care | Payer: BLUE CROSS/BLUE SHIELD | Source: Ambulatory Visit | Attending: Family Medicine | Admitting: Family Medicine

## 2015-10-26 DIAGNOSIS — Z124 Encounter for screening for malignant neoplasm of cervix: Secondary | ICD-10-CM | POA: Diagnosis present

## 2015-10-26 DIAGNOSIS — Z1151 Encounter for screening for human papillomavirus (HPV): Secondary | ICD-10-CM | POA: Insufficient documentation

## 2015-10-29 LAB — CYTOLOGY - PAP

## 2015-11-05 ENCOUNTER — Ambulatory Visit
Admission: RE | Admit: 2015-11-05 | Discharge: 2015-11-05 | Disposition: A | Discharge status: Home / Self Care | Payer: BLUE CROSS/BLUE SHIELD | Source: Ambulatory Visit

## 2015-11-05 DIAGNOSIS — Z1231 Encounter for screening mammogram for malignant neoplasm of breast: Secondary | ICD-10-CM

## 2016-03-31 ENCOUNTER — Other Ambulatory Visit: Payer: Self-pay | Admitting: Otolaryngology

## 2016-04-03 ENCOUNTER — Other Ambulatory Visit: Payer: Self-pay | Admitting: Otolaryngology

## 2016-04-03 DIAGNOSIS — H838X1 Other specified diseases of right inner ear: Secondary | ICD-10-CM

## 2016-04-10 ENCOUNTER — Ambulatory Visit
Admission: RE | Admit: 2016-04-10 | Discharge: 2016-04-10 | Disposition: A | Discharge status: Home / Self Care | Payer: BLUE CROSS/BLUE SHIELD | Source: Ambulatory Visit | Attending: Otolaryngology | Admitting: Otolaryngology

## 2016-04-10 DIAGNOSIS — H838X1 Other specified diseases of right inner ear: Secondary | ICD-10-CM

## 2016-07-20 ENCOUNTER — Telehealth: Payer: Self-pay | Admitting: Cardiovascular Disease

## 2016-07-20 NOTE — Telephone Encounter (Signed)
Received records from [facility] for appointment on 07/26/16 with Dr Allyson Sabal.  Records given to [facility] (medical records) for Dr Hazle Coca schedule on 07/26/16. lp

## 2016-07-26 ENCOUNTER — Ambulatory Visit (INDEPENDENT_AMBULATORY_CARE_PROVIDER_SITE_OTHER): Payer: BLUE CROSS/BLUE SHIELD | Admitting: Cardiovascular Disease

## 2016-07-26 ENCOUNTER — Encounter: Payer: Self-pay | Admitting: Cardiovascular Disease

## 2016-07-26 VITALS — BP 126/80 | HR 64 | Ht 66.0 in | Wt 175.8 lb

## 2016-07-26 DIAGNOSIS — R0600 Dyspnea, unspecified: Secondary | ICD-10-CM

## 2016-07-26 DIAGNOSIS — R0602 Shortness of breath: Secondary | ICD-10-CM | POA: Diagnosis not present

## 2016-07-26 NOTE — Assessment & Plan Note (Signed)
Mrs. Krista Chapman comes in today with complaints of fatigue and dyspnea. She really has no risk factors although she did have a prolonged episode of sepsis related to bowel perforation and multiple procedures after that. She's had a normal stress test and echo in the past. She is considerably more dyspneic now than she was in the past. I'm going to get a 2-D echo and routine GXT

## 2016-07-26 NOTE — Progress Notes (Signed)
07/26/2016 SHERRILL MCNALLEY   20-Sep-1956  [id number]  Primary Physician Neldon Labella, MD Primary Cardiologist: Runell Gess MD Nicholes Calamity, [place]  HPI:  This Lackman is a pleasant 60 year old divorced Caucasian female mother of one son, grandmother and one grandchild returns today for follow-up of dyspnea and fatigue. I saw her many years ago. She really has no risk factors. I did take care of her mother Jacqulyn Cane who had aortic stenosis. She had abdominal pain back in June 2014 and had an ERCP with Doppler perforation complication. She was transferred to [facility] where she spent 32 days in the hospital and was discharged home with 4 JP drains. Her major complaint is fatigue and increasing dyspnea on exertion. She denies chest pain.   Current Outpatient Prescriptions  Medication Sig Dispense Refill  . Calcium Carbonate-Vitamin D (CALCIUM 600+D HIGH POTENCY) 600-400 MG-UNIT per tablet Take 1 tablet by mouth daily.    . cholecalciferol (VITAMIN D) 1000 UNITS tablet Take 10,000 Units by mouth daily.     . fluorouracil (EFUDEX) 5 % cream Apply 1 application topically daily.    . fluticasone (FLONASE) 50 MCG/ACT nasal spray Place 1 spray into both nostrils daily as needed.     . hydrocortisone cream 1 % Apply topically as needed for itching. 30 g 0  . MILK THISTLE EXTRACT PO Take 3 tablets by mouth daily.    . Omega-3 Fatty Acids (FISH OIL PO) Take by mouth.    . ALPRAZolam (XANAX) 0.5 MG tablet Take 1 tablet by mouth daily as needed.  0  . mirtazapine (REMERON) 15 MG tablet Take 1 tablet by mouth at bedtime.  2   No current facility-administered medications for this visit.     Allergies  Allergen Reactions  . Xarelto [Rivaroxaban] Other (See Comments)    Internal bleeding  . Codeine Nausea Only    Social History   Social History  . Marital status: Single    Spouse name: N/A  . Number of children: N/A  . Years of education: N/A   Occupational History   . [employer]    Social History Main Topics  . Smoking status: Former Smoker    Packs/day: 0.10    Years: 3.00    Types: Cigarettes    Quit date: 10/09/1988  . Smokeless tobacco: Never Used  . Alcohol use 1.2 oz/week    2 Standard drinks or equivalent per week     Comment: socially  . Drug use: No  . Sexual activity: Not on file   Other Topics Concern  . Not on file   Social History Narrative   Single with 1 child   Right handed   High school graduate   1 cup daily     Review of Systems: General: negative for chills, fever, night sweats or weight changes.  Cardiovascular: negative for chest pain, dyspnea on exertion, edema, orthopnea, palpitations, paroxysmal nocturnal dyspnea or shortness of breath Dermatological: negative for rash Respiratory: negative for cough or wheezing Urologic: negative for hematuria Abdominal: negative for nausea, vomiting, diarrhea, bright red blood per rectum, melena, or hematemesis Neurologic: negative for visual changes, syncope, or dizziness All other systems reviewed and are otherwise negative except as noted above.    Blood pressure 126/80, pulse 64, height 5\' 6"  (1.676 m), weight 175 lb 12.8 oz (79.7 kg).  General appearance: alert and no distress Neck: no adenopathy, no carotid bruit, no JVD, supple, symmetrical, trachea midline and thyroid not  enlarged, symmetric, no tenderness/mass/nodules Lungs: clear to auscultation bilaterally Heart: regular rate and rhythm, S1, S2 normal, no murmur, click, rub or gallop Extremities: extremities normal, atraumatic, no cyanosis or edema  EKG sinus rhythm at 64 ST-T wave changes. I personally reviewed this EKG  ASSESSMENT AND PLAN:   Dyspnea Mrs. Adela LankFloyd comes in today with complaints of fatigue and dyspnea. She really has no risk factors although she did have a prolonged episode of sepsis related to bowel perforation and multiple procedures after that. She's had a normal stress test and echo in  the past. She is considerably more dyspneic now than she was in the past. I'm going to get a 2-D echo and routine GXT      Runell GessJonathan J. Berry MD Child Study And Treatment CenterFACP,FACC,FAHA, [facility] AbilitylabFSCAI 07/26/2016 2:34 PM

## 2016-07-26 NOTE — Patient Instructions (Addendum)
Medication Instructions:  NO CHANGES.  Labwork: NONE  Testing/Procedures: Your physician has requested that you have an exercise tolerance test. For further information please visit [email or web address] Please also follow instruction sheet, as given.   Your physician has requested that you have an echocardiogram. Echocardiography is a painless test that uses sound waves to create images of your heart. It provides your doctor with information about the size and shape of your heart and how well your heart's chambers and valves are working. This procedure takes approximately one hour. There are no restrictions for this procedure.    Follow-Up: Your physician recommends that you schedule a follow-up appointment in: 4-5 WEEKS WEEKS WITH DR BERRY FOLLOWING TESTING.    If you need a refill on your cardiac medications before your next appointment, please call your pharmacy.

## 2016-07-27 ENCOUNTER — Telehealth (HOSPITAL_COMMUNITY): Payer: Self-pay

## 2016-07-27 NOTE — Telephone Encounter (Signed)
Encounter complete. 

## 2016-08-01 ENCOUNTER — Ambulatory Visit (HOSPITAL_COMMUNITY)
Admission: RE | Admit: 2016-08-01 | Discharge: 2016-08-01 | Disposition: A | Discharge status: Home / Self Care | Payer: BLUE CROSS/BLUE SHIELD | Source: Ambulatory Visit | Attending: Cardiovascular Disease | Admitting: Cardiovascular Disease

## 2016-08-01 DIAGNOSIS — R0600 Dyspnea, unspecified: Secondary | ICD-10-CM | POA: Diagnosis not present

## 2016-08-01 DIAGNOSIS — R0602 Shortness of breath: Secondary | ICD-10-CM | POA: Diagnosis not present

## 2016-08-01 LAB — EXERCISE TOLERANCE TEST
CHL CUP RESTING HR STRESS: 68 {beats}/min
CHL RATE OF PERCEIVED EXERTION: 17
Estimated workload: 11.1 METS
Exercise duration (min): 9 min
Exercise duration (sec): 40 s
MPHR: 161 {beats}/min
Peak HR: 155 {beats}/min
Percent HR: 96 %

## 2016-08-01 NOTE — Addendum Note (Signed)
Addended by: Lorain Childes A on: 08/01/2016 05:03 PM   Modules accepted: Orders

## 2016-08-11 ENCOUNTER — Ambulatory Visit (HOSPITAL_COMMUNITY): Payer: BLUE CROSS/BLUE SHIELD | Attending: Cardiovascular Disease

## 2016-08-11 ENCOUNTER — Other Ambulatory Visit: Payer: Self-pay

## 2016-08-11 DIAGNOSIS — R0602 Shortness of breath: Secondary | ICD-10-CM

## 2016-08-11 DIAGNOSIS — I340 Nonrheumatic mitral (valve) insufficiency: Secondary | ICD-10-CM | POA: Diagnosis not present

## 2016-08-11 DIAGNOSIS — R0600 Dyspnea, unspecified: Secondary | ICD-10-CM | POA: Insufficient documentation

## 2016-08-11 DIAGNOSIS — I371 Nonrheumatic pulmonary valve insufficiency: Secondary | ICD-10-CM | POA: Insufficient documentation

## 2016-08-11 DIAGNOSIS — Z87891 Personal history of nicotine dependence: Secondary | ICD-10-CM | POA: Insufficient documentation

## 2016-08-25 ENCOUNTER — Ambulatory Visit (INDEPENDENT_AMBULATORY_CARE_PROVIDER_SITE_OTHER): Payer: BLUE CROSS/BLUE SHIELD | Admitting: Cardiovascular Disease

## 2016-08-25 DIAGNOSIS — R0600 Dyspnea, unspecified: Secondary | ICD-10-CM | POA: Diagnosis not present

## 2016-08-25 NOTE — Progress Notes (Signed)
Krista Chapman returns today for follow-up of outpatient diagnostic tests performed to evaluate dyspnea on exertion. Her routine GXT was completely normal as was her 2-D echocardiogram. I reassured her that I do not think that her symptoms are cardiovascularly mediated. I will see her back in one year for follow-up. 

## 2016-08-25 NOTE — Patient Instructions (Signed)

## 2016-08-25 NOTE — Assessment & Plan Note (Signed)
Krista Chapman returns today for follow-up of outpatient diagnostic tests performed to evaluate dyspnea on exertion. Her routine GXT was completely normal as was her 2-D echocardiogram. I reassured her that I do not think that her symptoms are cardiovascularly mediated. I will see her back in one year for follow-up.

## 2016-08-27 ENCOUNTER — Encounter: Payer: Self-pay | Admitting: Cardiovascular Disease

## 2016-10-12 ENCOUNTER — Other Ambulatory Visit: Payer: Self-pay | Admitting: Ophthalmology

## 2016-10-20 ENCOUNTER — Other Ambulatory Visit: Payer: Self-pay | Admitting: Family Medicine

## 2016-10-20 DIAGNOSIS — Z1231 Encounter for screening mammogram for malignant neoplasm of breast: Secondary | ICD-10-CM

## 2016-11-15 ENCOUNTER — Ambulatory Visit
Admission: RE | Admit: 2016-11-15 | Discharge: 2016-11-15 | Disposition: A | Discharge status: Home / Self Care | Payer: BLUE CROSS/BLUE SHIELD | Source: Ambulatory Visit | Attending: Family Medicine | Admitting: Family Medicine

## 2016-11-15 DIAGNOSIS — Z1231 Encounter for screening mammogram for malignant neoplasm of breast: Secondary | ICD-10-CM

## 2016-11-30 ENCOUNTER — Other Ambulatory Visit: Payer: Self-pay | Admitting: Family Medicine

## 2016-11-30 DIAGNOSIS — M79605 Pain in left leg: Secondary | ICD-10-CM

## 2016-11-30 DIAGNOSIS — M79604 Pain in right leg: Secondary | ICD-10-CM

## 2017-01-15 ENCOUNTER — Emergency Department (HOSPITAL_BASED_OUTPATIENT_CLINIC_OR_DEPARTMENT_OTHER)
Admission: EM | Admit: 2017-01-15 | Discharge: 2017-01-15 | Disposition: A | Discharge status: Home / Self Care | Payer: BLUE CROSS/BLUE SHIELD | Attending: Emergency Medicine | Admitting: Emergency Medicine

## 2017-01-15 ENCOUNTER — Encounter (HOSPITAL_BASED_OUTPATIENT_CLINIC_OR_DEPARTMENT_OTHER): Payer: Self-pay | Admitting: Emergency Medicine

## 2017-01-15 ENCOUNTER — Emergency Department (HOSPITAL_BASED_OUTPATIENT_CLINIC_OR_DEPARTMENT_OTHER): Payer: BLUE CROSS/BLUE SHIELD

## 2017-01-15 DIAGNOSIS — Z79899 Other long term (current) drug therapy: Secondary | ICD-10-CM | POA: Diagnosis not present

## 2017-01-15 DIAGNOSIS — M79605 Pain in left leg: Secondary | ICD-10-CM | POA: Diagnosis not present

## 2017-01-15 DIAGNOSIS — E039 Hypothyroidism, unspecified: Secondary | ICD-10-CM | POA: Diagnosis not present

## 2017-01-15 DIAGNOSIS — Z87891 Personal history of nicotine dependence: Secondary | ICD-10-CM | POA: Diagnosis not present

## 2017-01-15 NOTE — ED Notes (Signed)
Pt is off the floor in US.

## 2017-01-15 NOTE — ED Triage Notes (Signed)
L leg pain since yesterday, no injury.

## 2017-01-15 NOTE — Discharge Instructions (Signed)
You have a diminished pulse in your left foot. You need to follow up with your primary care provider and may need an arterial doppler study to better assess this.

## 2017-01-15 NOTE — ED Provider Notes (Signed)
[facility] Provider Note   CSN: [id number] Arrival date & time: 01/15/17  1319  By signing my name below, I, Modena Jansky, attest that this documentation has been prepared under the direction and in the presence of Rolan Bucco, MD. Electronically Signed: Modena Jansky, Scribe. 01/15/2017. 3:03 PM.  History   Chief Complaint Chief Complaint  Patient presents with  . Leg Pain   The history is provided by the patient. No language interpreter was used.   HPI Comments: Krista Chapman is a 61 y.o. female who presents to the Emergency Department complaining of constant moderate LLE pain that started yesterday. She states she started having a tingling sensation in her left foot about 2 months ago. She started having pneumonia a few weeks ago and has been taking Levoquin. She woke up with pain from her left mid-thigh down yesterday with no known injury. She took ibuprofen today. Her pain is exacerbated by ambulation. She reports associated LLE swelling ("tight sensation with knee bending"). She admits to a hx of blood clot. Denies any chest pain, SOB, back pain, abdominal pain, groin pain, vomiting, diarrhea, or other complaints at this time.    PCP: Neldon Labella, MD  Past Medical History:  Diagnosis Date  . Constipation   . Gallstones   . Hypothyroidism   . Palpitation     Patient Active Problem List   Diagnosis Date Noted  . Fever, unspecified 04/24/2014  . Retroperitoneal air: Post ERCP retroperitoneal perforation 04/21/2014  . ARF (acute renal failure) (HCC) 04/21/2014  . Common bile duct stone 04/17/2014  . Choledocholithiasis 04/17/2014  . Paresthesias 03/18/2014  . Dyspnea 05/10/2011  . Sinus congestion 05/10/2011    Past Surgical History:  Procedure Laterality Date  . ANKLE SURGERY Left 2004  . CHOLECYSTECTOMY    . COLONOSCOPY    . ERCP N/A 04/19/2014   Procedure: ENDOSCOPIC RETROGRADE CHOLANGIOPANCREATOGRAPHY (ERCP);  Surgeon: Willis Modena, MD;   Location: [facility];  Service: Endoscopy;  Laterality: N/A;    OB History    No data available       Home Medications    Prior to Admission medications   Medication Sig Start Date End Date Taking? Authorizing Provider  ALPRAZolam Prudy Feeler) 0.5 MG tablet Take 1 tablet by mouth daily as needed. 07/04/16   Historical Provider, MD  Calcium Carbonate-Vitamin D (CALCIUM 600+D HIGH POTENCY) 600-400 MG-UNIT per tablet Take 1 tablet by mouth daily.    Historical Provider, MD  cholecalciferol (VITAMIN D) 1000 UNITS tablet Take 10,000 Units by mouth daily.     Historical Provider, MD  fluorouracil (EFUDEX) 5 % cream Apply 1 application topically daily. 04/03/16   Historical Provider, MD  fluticasone (FLONASE) 50 MCG/ACT nasal spray Place 1 spray into both nostrils daily as needed.     Historical Provider, MD  hydrocortisone cream 1 % Apply topically as needed for itching. 04/27/14   Rodolph Bong, MD  MILK THISTLE EXTRACT PO Take 3 tablets by mouth daily.    Historical Provider, MD  mirtazapine (REMERON) 15 MG tablet Take 1 tablet by mouth at bedtime. 07/17/16   Historical Provider, MD  Omega-3 Fatty Acids (FISH OIL PO) Take by mouth.    Historical Provider, MD    Family History Family History  Problem Relation Age of Onset  . Emphysema Father   . Diabetes Father   . Heart disease Mother   . Diabetes Mother   . Diabetes Sister   . Diabetes Brother     Social  History Social History  Substance Use Topics  . Smoking status: Former Smoker    Packs/day: 0.10    Years: 3.00    Types: Cigarettes    Quit date: 10/09/1988  . Smokeless tobacco: Never Used  . Alcohol use 1.2 oz/week    2 Standard drinks or equivalent per week     Comment: socially     Allergies   Xarelto [rivaroxaban] and Codeine   Review of Systems Review of Systems  Constitutional: Negative for chills, diaphoresis, fatigue and fever.  HENT: Negative for congestion, rhinorrhea and sneezing.   Eyes: Negative.     Respiratory: Negative for cough, chest tightness and shortness of breath.   Cardiovascular: Positive for leg swelling (Left). Negative for chest pain.  Gastrointestinal: Negative for abdominal pain, blood in stool, diarrhea, nausea and vomiting.  Genitourinary: Negative for difficulty urinating, flank pain, frequency and hematuria.  Musculoskeletal: Positive for myalgias. Negative for arthralgias and back pain.  Skin: Negative for rash.  Neurological: Negative for dizziness, speech difficulty, weakness, numbness and headaches.     Physical Exam Updated Vital Signs BP 126/89 (BP Location: Right Arm)   Pulse 71   Temp 98.4 F (36.9 C) (Oral)   Resp 16   Wt 176 lb (79.8 kg)   SpO2 99%   BMI 28.41 kg/m   Physical Exam  Constitutional: She is oriented to person, place, and time. She appears well-developed and well-nourished.  HENT:  Head: Normocephalic and atraumatic.  Eyes: Pupils are equal, round, and reactive to light.  Neck: Normal range of motion. Neck supple.  Cardiovascular: Normal rate, regular rhythm and normal heart sounds.   Pulmonary/Chest: Effort normal and breath sounds normal. No respiratory distress. She has no wheezes. She has no rales. She exhibits no tenderness.  Abdominal: Soft. Bowel sounds are normal. There is no tenderness. There is no rebound and no guarding.  Musculoskeletal: Normal range of motion. She exhibits no edema.  No visible swelling to the leg. Normal sensation and motor function to the leg. No pain to the spine. Mild TTP to the left posterior calf. No bony TTP with ROM and palpation. Pedal pusles are not palpated, but no discoloration or coolness is appreciated  Lymphadenopathy:    She has no cervical adenopathy.  Neurological: She is alert and oriented to person, place, and time.  Skin: Skin is warm and dry. No rash noted.  Psychiatric: She has a normal mood and affect.     ED Treatments / Results  DIAGNOSTIC STUDIES: Oxygen Saturation is  99% on RA, normal by my interpretation.    COORDINATION OF CARE: 3:07 PM- Pt advised of plan for treatment and pt agrees.  Labs (all labs ordered are listed, but only abnormal results are displayed) Labs Reviewed - No data to display  EKG  EKG Interpretation None       Radiology US Venous Img Lower Unilateral Left  Result Date: 01/15/2017 CLINICAL DATA:  Leg pain and swelling EXAM: Left LOWER EXTREMITY VENOUS DOPPLER ULTRASOUND TECHNIQUE: Gray-scale sonography with graded compression, as well as color Doppler and duplex ultrasound were performed to evaluate the lower extremity deep venous systems from the level of the common femoral vein and including the common femoral, femoral, profunda femoral, popliteal and calf veins including the posterior tibial, peroneal and gastrocnemius veins when visible. The superficial great saphenous vein was also interrogated. Spectral Doppler was utilized to evaluate flow at rest and with distal augmentation maneuvers in the common femoral, femoral and popliteal veins. COMPARISON:  None. FINDINGS: Contralateral Common Femoral Vein: Respiratory phasicity is normal and symmetric with the symptomatic side. No evidence of thrombus. Normal compressibility. Common Femoral Vein: No evidence of thrombus. Normal compressibility, respiratory phasicity and response to augmentation. Saphenofemoral Junction: No evidence of thrombus. Normal compressibility and flow on color Doppler imaging. Profunda Femoral Vein: No evidence of thrombus. Normal compressibility and flow on color Doppler imaging. Femoral Vein: No evidence of thrombus. Normal compressibility, respiratory phasicity and response to augmentation. Popliteal Vein: No evidence of thrombus. Normal compressibility, respiratory phasicity and response to augmentation. Calf Veins: No evidence of thrombus. Normal compressibility and flow on color Doppler imaging. Superficial Great Saphenous Vein: No evidence of thrombus. Normal  compressibility and flow on color Doppler imaging. Venous Reflux:  None. Other Findings:  Baker cyst 2.9 x 0.8 x 1.1 cm Knee joint effusion IMPRESSION: No evidence of deep venous thrombosis. Baker cyst Knee joint effusion Electronically Signed   By: Marlan Palau M.D.   On: 01/15/2017 16:49    Procedures Procedures (including critical care time)  Medications Ordered in ED Medications - No data to display   Initial Impression / Assessment and Plan / ED Course  I have reviewed the triage vital signs and the nursing notes.  Pertinent labs & imaging results that were available during my care of the patient were reviewed by me and considered in my medical decision making (see chart for details).     Patient presents with tingling to her left foot for about 2 months and pain in her left leg since yesterday. She describes it as a dull achy pain throughout her leg. There is no visible swelling. No evidence of a DVT. She has pulses by Doppler in the right foot. On the left foot, I cannot appreciate a dorsalis pedis pulse but she does have a strong posterior tibial pulse. She has good color and warmth. There is no signs of diminished perfusion. However given her symptoms, I did advise her follow-up with her PCP and she may need a complete arterial Doppler performed. She denies any for any further pain medication. Return precautions were given.  Final Clinical Impressions(s) / ED Diagnoses   Final diagnoses:  Left leg pain    New Prescriptions New Prescriptions   No medications on file   I personally performed the services described in this documentation, which was scribed in my presence.  The recorded information has been reviewed and considered.     Rolan Bucco, MD 01/15/17 [phone]

## 2017-09-18 IMAGING — CT CT TEMPORAL BONES W/O CM
1 series · 15 of 30 positions shown, 19 images · non-contrast
Comparison: CT of the temporal bones performed 08/29/2013.

CLINICAL DATA: Suspected superior semicircular canal dehiscence in
the RIGHT ear, getting worse with time. Vibration of RIGHT facial
bones when someone is talking to the patient.

EXAM:
CT TEMPORAL BONES WITHOUT CONTRAST
TECHNIQUE: Axial and coronal plane CT imaging of the petrous temporal bones was
performed with thin-collimation image reconstruction. No intravenous
contrast was administered. Multiplanar CT image reconstructions were
also generated.

[Series 4: brain · axial · 0.40mm/px · z∈[-130,-76]mm · 15 of 31 slices shown, 19 images]
[im 2/31  brain]
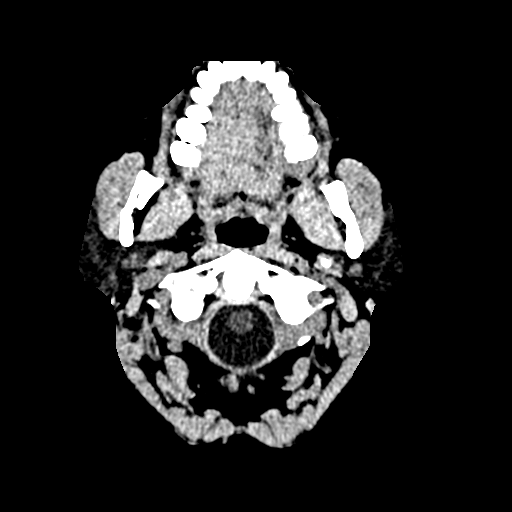
[im 2/31  bone]
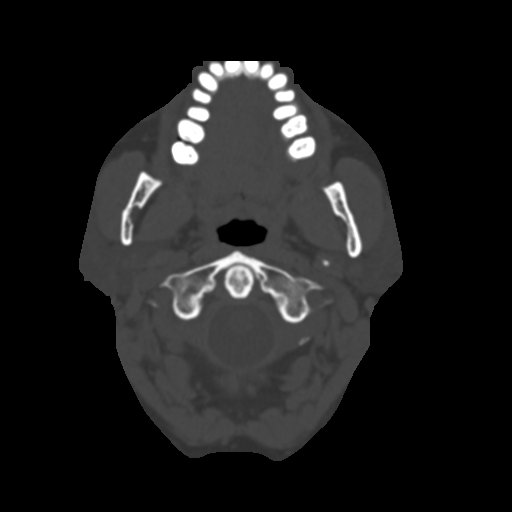
[im 4/31  bone]
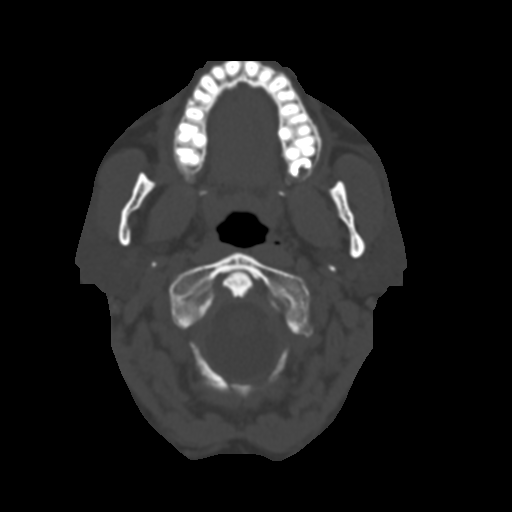
[im 6/31  bone]
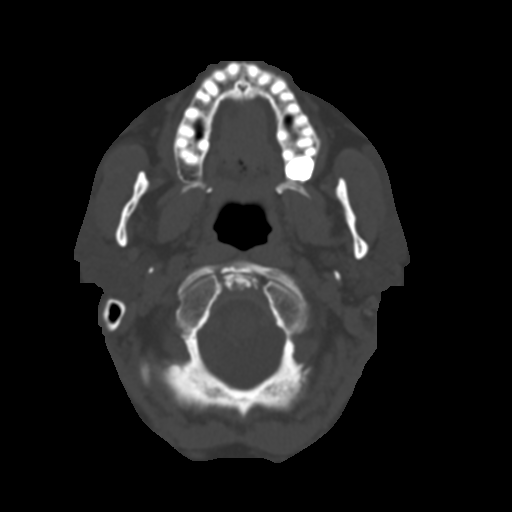
[im 8/31  bone]
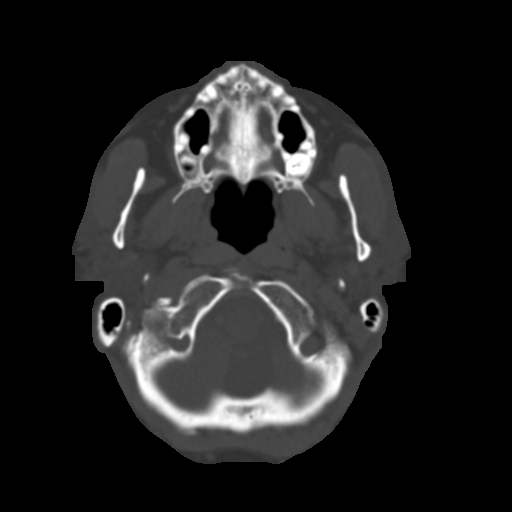
[im 10/31  brain]
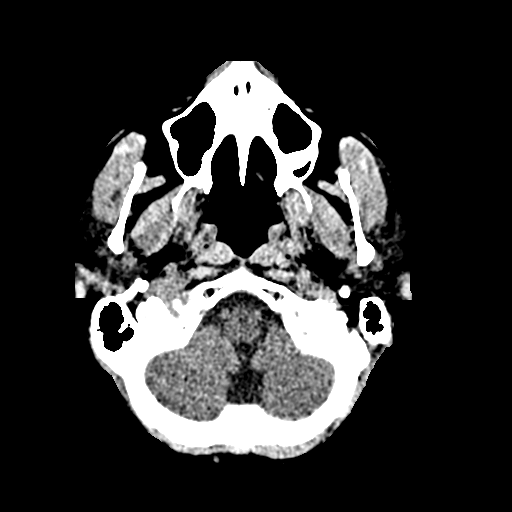
[im 10/31  bone]
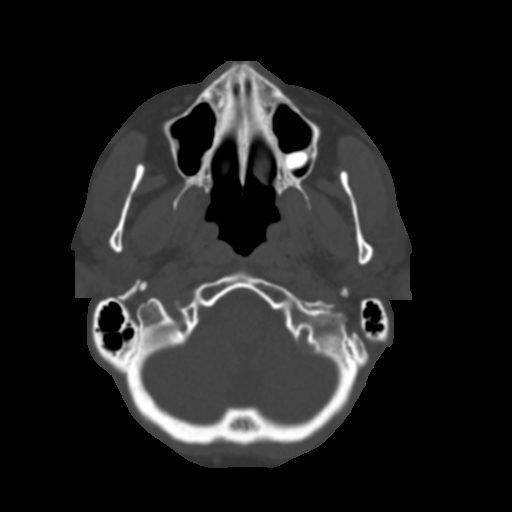
[im 12/31  bone]
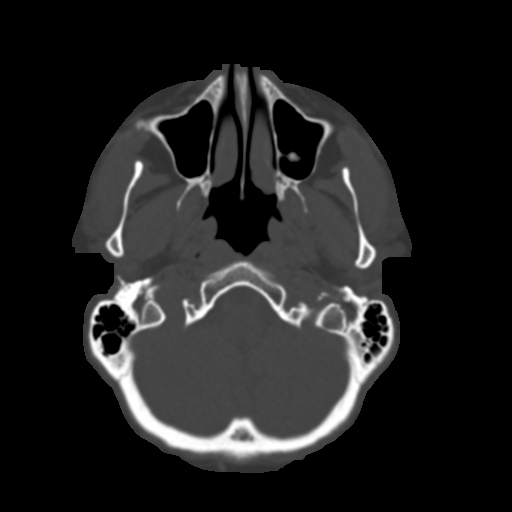
[im 14/31  bone]
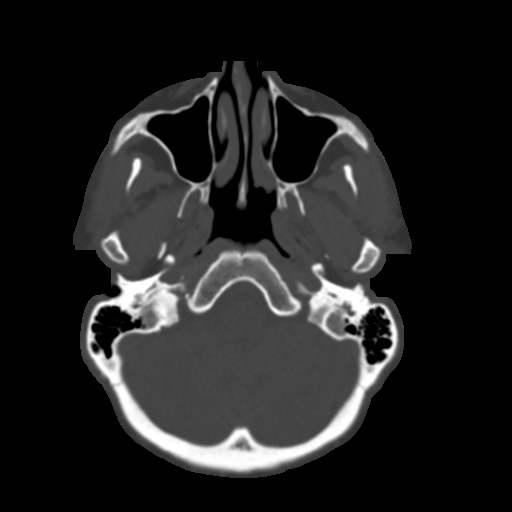
[im 16/31  bone]
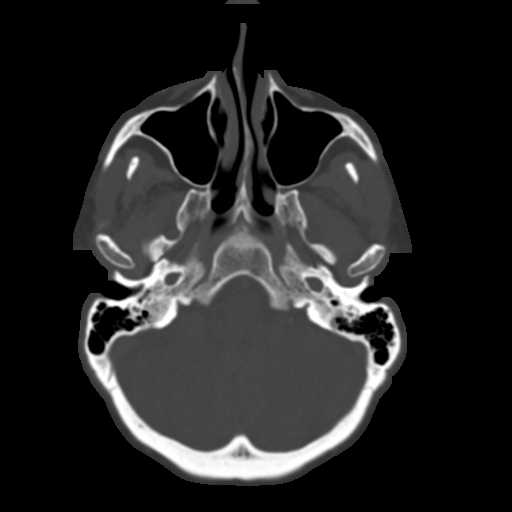
[im 17/31  brain]
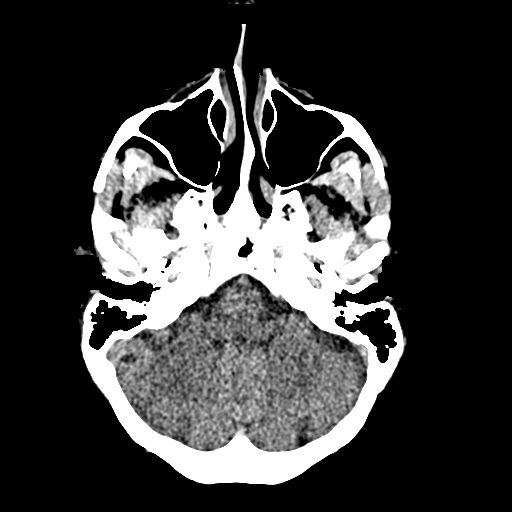
[im 17/31  bone]
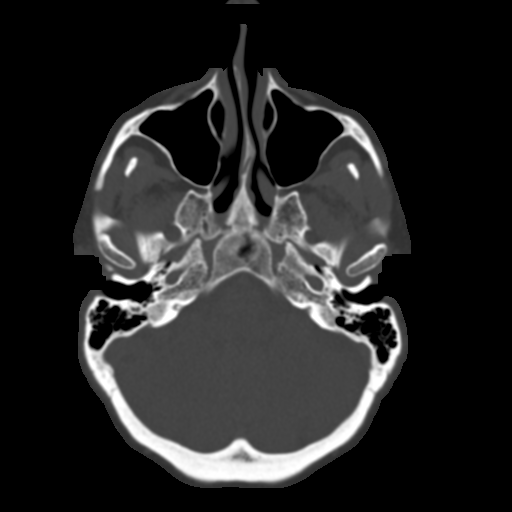
[im 19/31  bone]
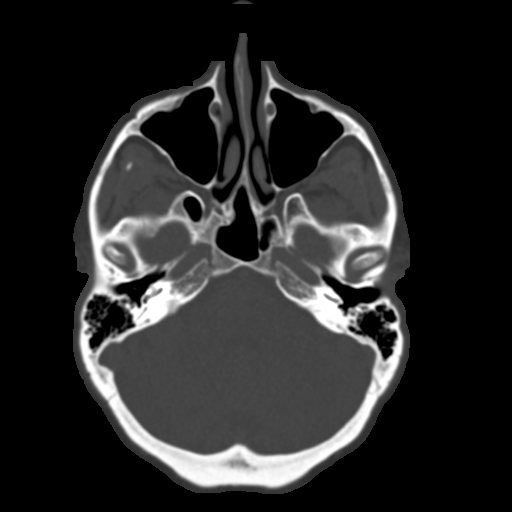
[im 21/31  bone]
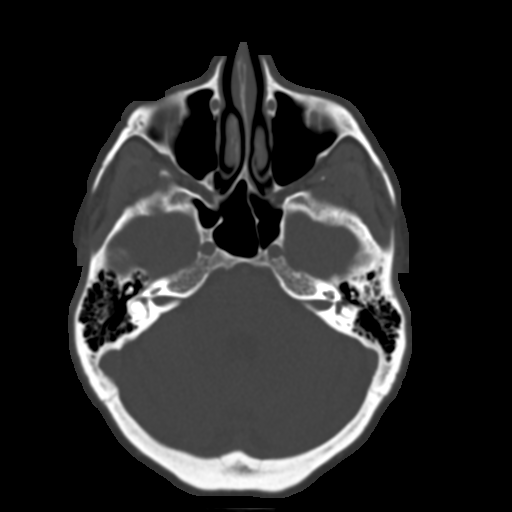
[im 23/31  bone]
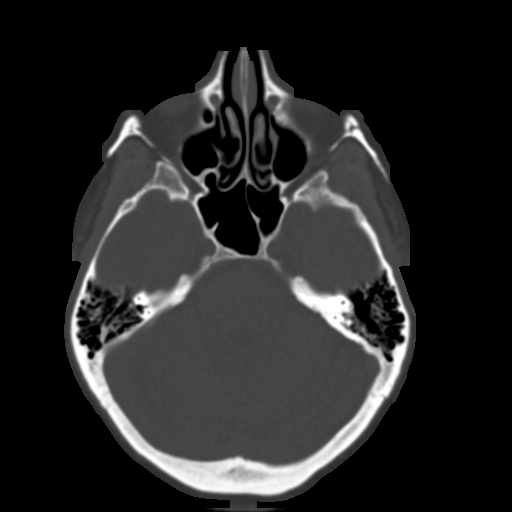
[im 25/31  brain]
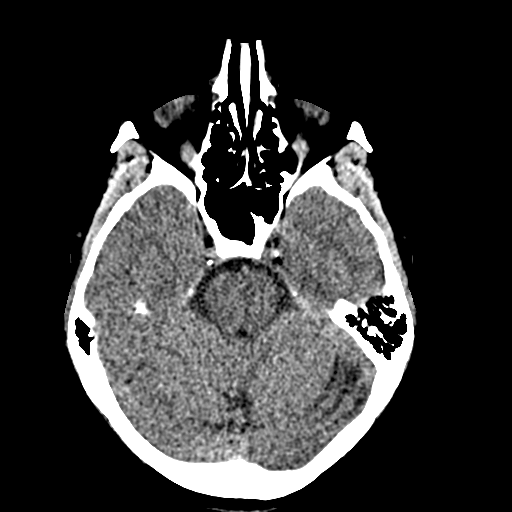
[im 25/31  bone]
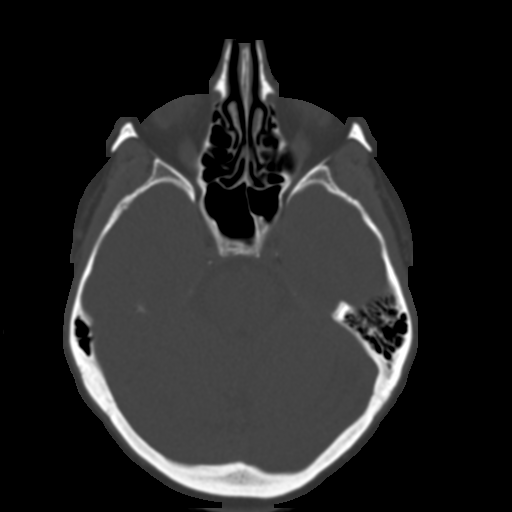
[im 27/31  bone]
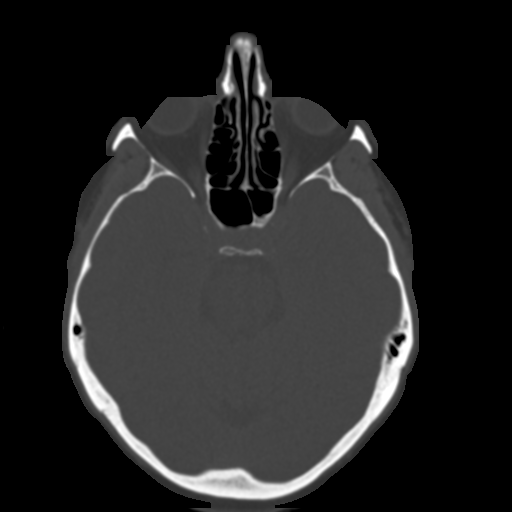
[im 29/31  bone]
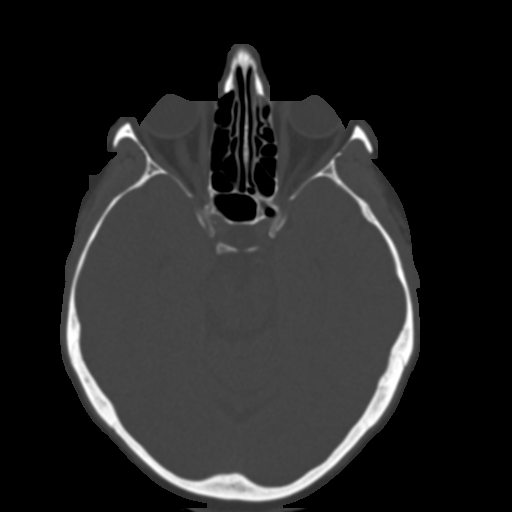

[15 of 30 positions shown; findings below may reference images not displayed]

FINDINGS: The LEFT ear is normal.

The RIGHT external canal is widely patent. RIGHT middle ear displays
normal tympanic membrane and ossicles. There is no middle ear or
mastoid fluid or coalescence.

In the RIGHT inner ear, there is re-demonstration of dehiscence of
the superior semicircular canal, as identified on previous CT of
temporal bones from 08/29/2013. Based on reformatted [place] views
from independent workstation, the dehiscence appears to be greater
over the 3 year interval. Otherwise negative labyrinthine
structures.

Normal visualized intracranial compartment. No layering sinus fluid.
IMPRESSION: Worsening dehiscence of the RIGHT superior semicircular canal.

Otherwise negative study.

## 2017-11-16 ENCOUNTER — Other Ambulatory Visit: Payer: Self-pay | Admitting: Family Medicine

## 2017-11-16 DIAGNOSIS — Z1231 Encounter for screening mammogram for malignant neoplasm of breast: Secondary | ICD-10-CM

## 2017-12-03 IMAGING — US US EXTREM LOW VENOUS*L*
1 series · 13 of 24 positions shown · non-contrast
Comparison: None.

CLINICAL DATA: Leg pain and swelling



[Series 1: us extrem low venous*left* · 0.08mm/px · 33 acquisitions, 13 frames shown]
[im 1/33]
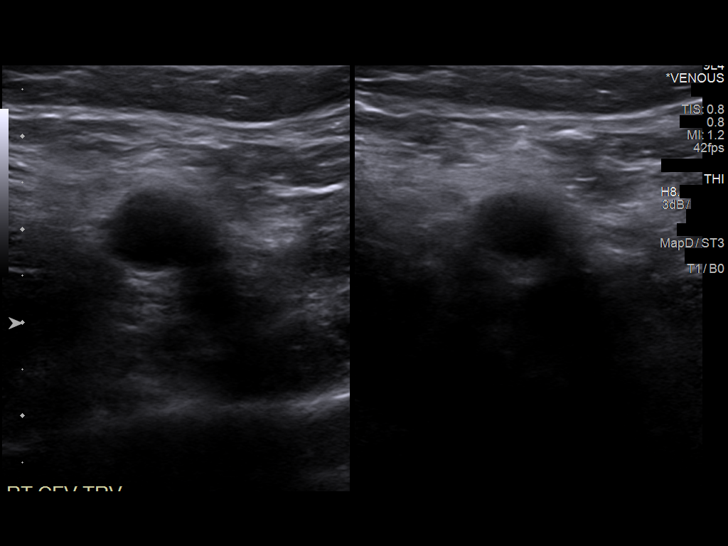
[im 3/33]
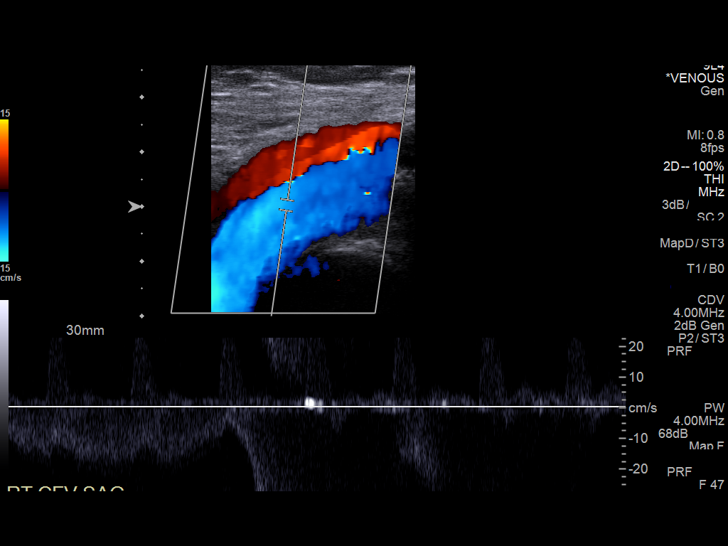
[im 6/33]
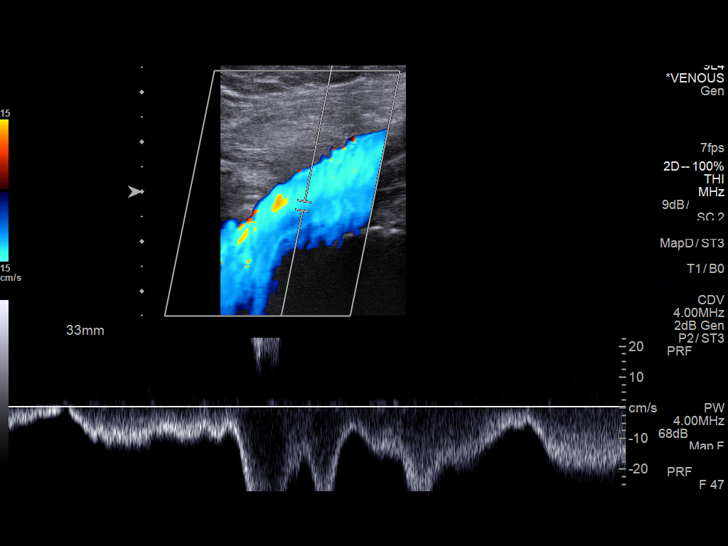
[im 9/33]
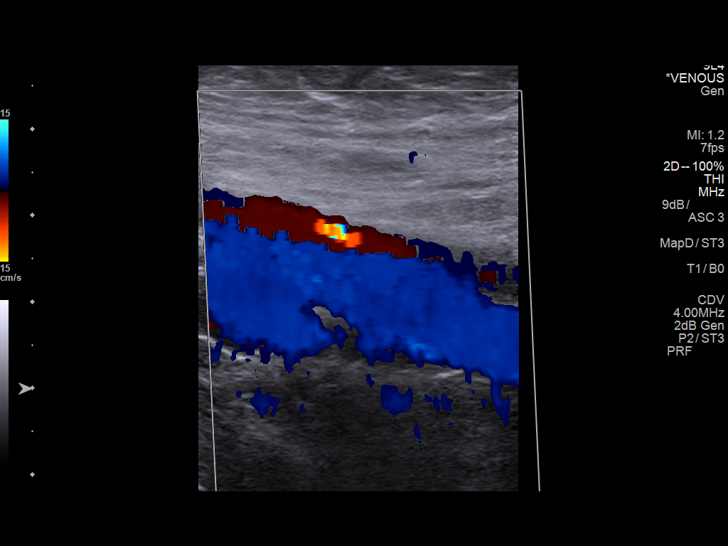
[im 12/33]
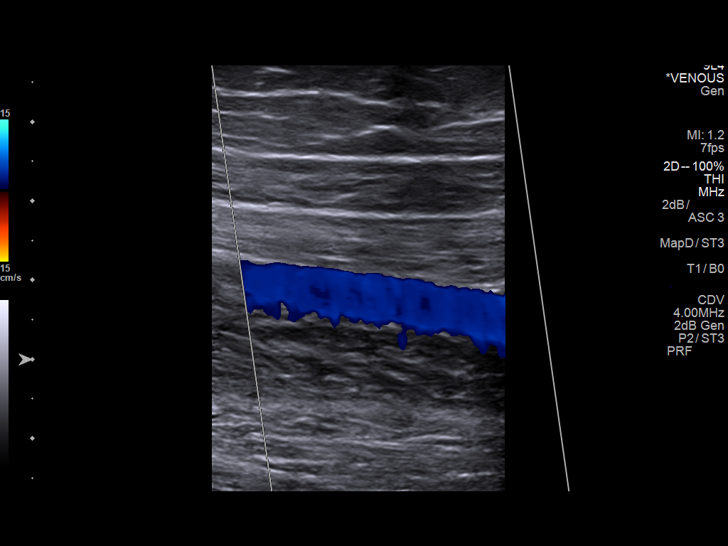
[im 14/33]
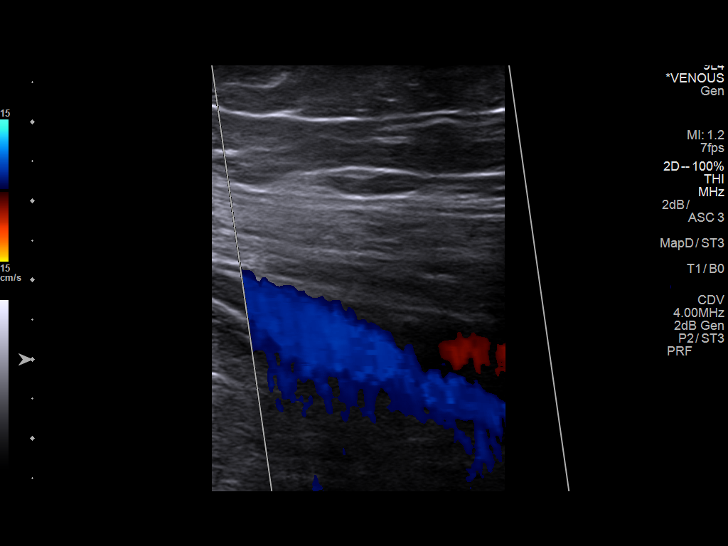
[im 19/33]
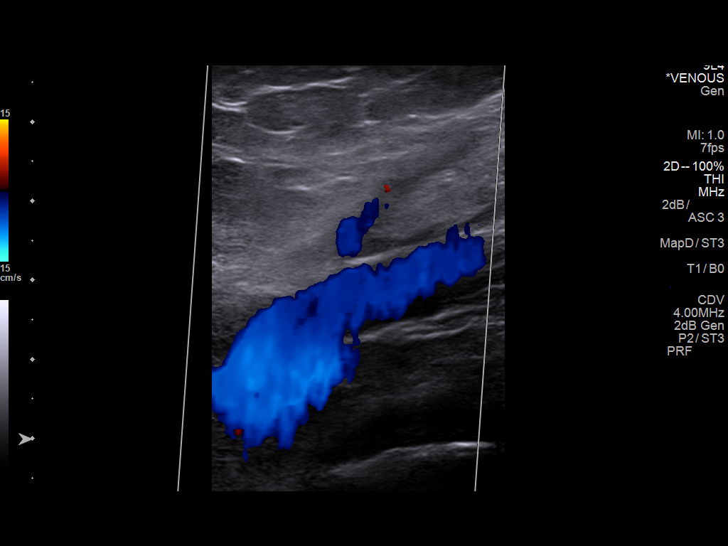
[im 20/33]
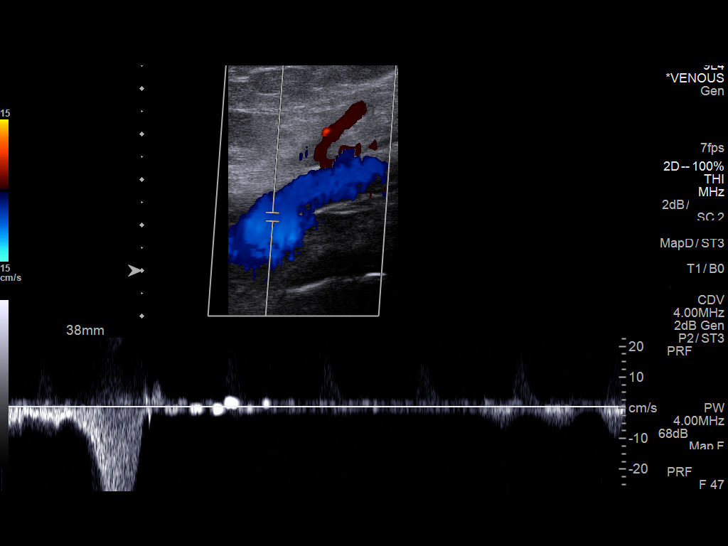
[im 23/33]
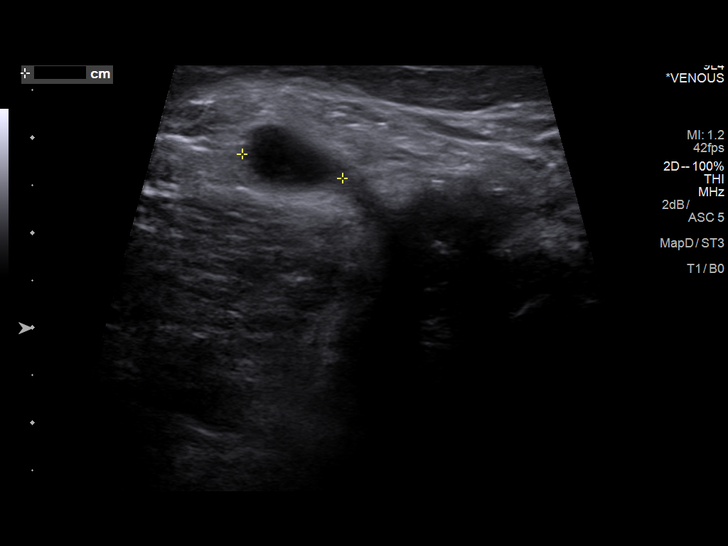
[im 26/33]
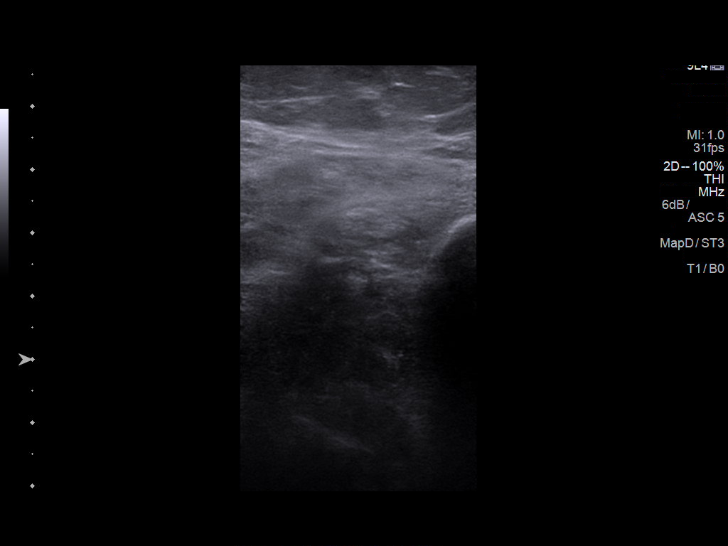
[im 27/33]
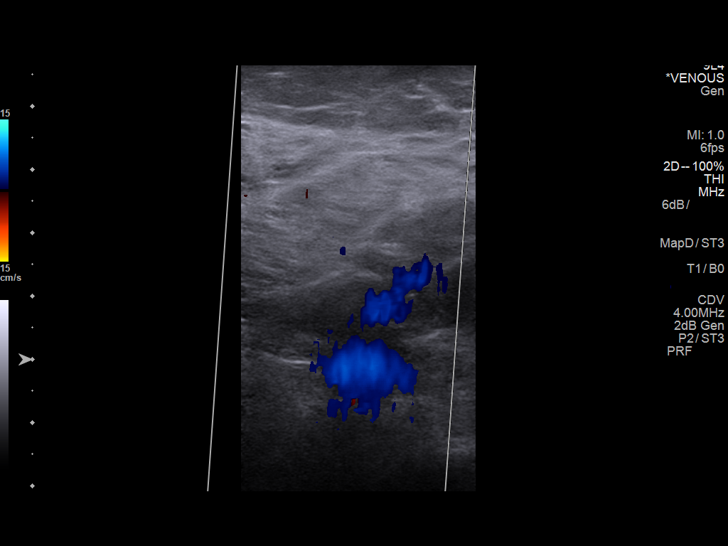
[im 30/33]
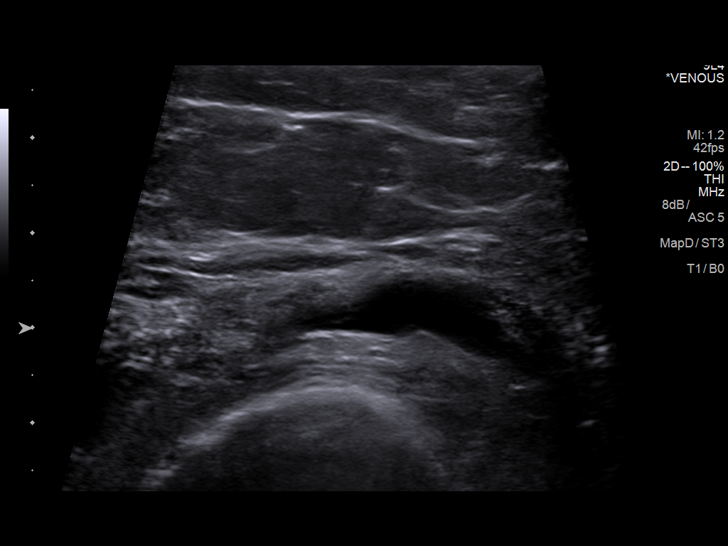
[im 33/33]
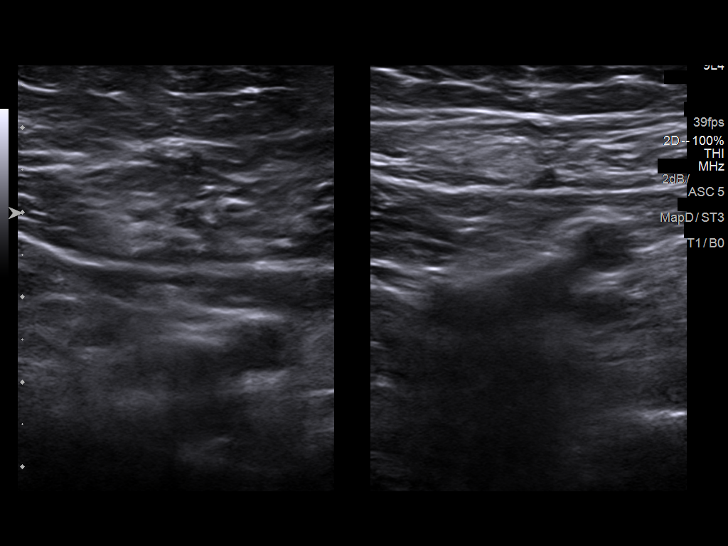

[13 of 24 positions shown; findings below may reference images not displayed]

FINDINGS: Contralateral Common Femoral Vein: Respiratory phasicity is normal
and symmetric with the symptomatic side. No evidence of thrombus.
Normal compressibility.

Common Femoral Vein: No evidence of thrombus. Normal
compressibility, respiratory phasicity and response to augmentation.

Saphenofemoral Junction: No evidence of thrombus. Normal
compressibility and flow on color Doppler imaging.

Profunda Femoral Vein: No evidence of thrombus. Normal
compressibility and flow on color Doppler imaging.

Femoral Vein: No evidence of thrombus. Normal compressibility,
respiratory phasicity and response to augmentation.

Popliteal Vein: No evidence of thrombus. Normal compressibility,
respiratory phasicity and response to augmentation.

Calf Veins: No evidence of thrombus. Normal compressibility and flow
on color Doppler imaging.

Superficial Great Saphenous Vein: No evidence of thrombus. Normal
compressibility and flow on color Doppler imaging.

Venous Reflux:  None.

Other Findings:  Baker cyst 2.9 x 0.8 x 1.1 cm

Knee joint effusion
IMPRESSION: No evidence of deep venous thrombosis.

Baker cyst

Knee joint effusion

## 2017-12-26 ENCOUNTER — Ambulatory Visit
Admission: RE | Admit: 2017-12-26 | Discharge: 2017-12-26 | Disposition: A | Discharge status: Home / Self Care | Payer: BLUE CROSS/BLUE SHIELD | Source: Ambulatory Visit | Attending: Family Medicine | Admitting: Family Medicine

## 2017-12-26 DIAGNOSIS — Z1231 Encounter for screening mammogram for malignant neoplasm of breast: Secondary | ICD-10-CM
